# Patient Record
Sex: Male | Born: 1975 | Race: White | Hispanic: No | Marital: Married | Smoking: Current some day smoker
Health system: Southern US, Community
[De-identification: ages and names within clinical notes are randomized; demographics above are authoritative.]

## PROBLEM LIST (undated history)

## (undated) DIAGNOSIS — F419 Anxiety disorder, unspecified: Secondary | ICD-10-CM

## (undated) DIAGNOSIS — K859 Acute pancreatitis without necrosis or infection, unspecified: Secondary | ICD-10-CM

## (undated) DIAGNOSIS — N4400 Torsion of testis, unspecified: Secondary | ICD-10-CM

## (undated) DIAGNOSIS — J45909 Unspecified asthma, uncomplicated: Secondary | ICD-10-CM

## (undated) DIAGNOSIS — N200 Calculus of kidney: Secondary | ICD-10-CM

## (undated) DIAGNOSIS — Z87891 Personal history of nicotine dependence: Secondary | ICD-10-CM

## (undated) HISTORY — DX: Torsion of testis, unspecified: N44.00

## (undated) HISTORY — DX: Calculus of kidney: N20.0

## (undated) HISTORY — DX: Unspecified asthma, uncomplicated: J45.909

## (undated) HISTORY — DX: Acute pancreatitis without necrosis or infection, unspecified: K85.90

## (undated) HISTORY — DX: Personal history of nicotine dependence: Z87.891

---
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
## 2010-02-26 ENCOUNTER — Ambulatory Visit: Payer: Self-pay | Admitting: Family Medicine

## 2010-02-26 DIAGNOSIS — B359 Dermatophytosis, unspecified: Secondary | ICD-10-CM | POA: Insufficient documentation

## 2010-03-05 LAB — CONVERTED CEMR LAB
ALT: 26 units/L (ref 0–53)
AST: 25 units/L (ref 0–37)
Albumin: 3.8 g/dL (ref 3.5–5.2)
Alkaline Phosphatase: 64 units/L (ref 39–117)
BUN: 9 mg/dL (ref 6–23)
Bilirubin, Direct: 0.1 mg/dL (ref 0.0–0.3)
CO2: 27 meq/L (ref 19–32)
Calcium: 9.1 mg/dL (ref 8.4–10.5)
Chloride: 104 meq/L (ref 96–112)
Cholesterol: 164 mg/dL (ref 0–200)
Creatinine, Ser: 0.8 mg/dL (ref 0.4–1.5)
GFR calc non Af Amer: 112.03 mL/min (ref >60.00)
Glucose, Bld: 84 mg/dL (ref 70–99)
HDL: 41.3 mg/dL (ref >39.00)
LDL Cholesterol: 113 mg/dL — ABNORMAL HIGH (ref 0–99)
Potassium: 4.3 meq/L (ref 3.5–5.1)
Sodium: 138 meq/L (ref 135–145)
Total Bilirubin: 0.5 mg/dL (ref 0.3–1.2)
Total CHOL/HDL Ratio: 4
Total Protein: 6.5 g/dL (ref 6.0–8.3)
Triglycerides: 51 mg/dL (ref 0.0–149.0)
VLDL: 10.2 mg/dL (ref 0.0–40.0)

## 2010-04-02 NOTE — Assessment & Plan Note (Signed)
Summary: NEW PT TO EST/CPX/CLE   Vital Signs:  Patient profile:   35 year old male Height:      73 inches Weight:      208.75 pounds BMI:     27.64 Temp:     98.5 degrees F oral Pulse rate:   80 / minute Pulse rhythm:   regular BP sitting:   122 / 80  (left arm) Cuff size:   large  Vitals Entered By: Selena Batten Dance CMA Duncan Dull) (February 26, 2010 10:33 AM) CC: New patient to establish care Comments ? Ringworm   History of Present Illness: CC: establish, ?ringworm  1. ringworm, a few spots on face, one on stomach.  Lotrimin makes it go away (uses 1-2 wks).  Currently one spot R cheek and one spot R stomach.  No new pets.  Wife's family has dog.  Has noticed for 4 mo now.  intolerant to cornmeal  preventative: flu shot - declines tetanus - unsure.  requests Tdap today no recent blood work.  maybe 2 years ago.  Thinks [place].     Current Medications (verified): 1)  None  Allergies (verified): No Known Drug Allergies  Past History:  Past Medical History: asthma as child pancreatitis 2006  Past Surgical History: L testicular surgery for torsion 1995  Family History: F: healthy M: slight MI (mid 38s) (smoker) MGM: DM PGF: brain tumor  No CAD/MI, CVA, other CA  Social History: quit smoking (2009)  ~8 PY hx, no EtOH, no rec drugs caffeine: 3-4 sodas/day, 1-2 cups sweet tea/day Occupation: Financial planner at [employer], broadcasting/film/video, Tree surgeon Lives with wife, no pets some college, [employer] eats salads, some fruits and vegetables activity - walk occasionally  Review of Systems       The patient complains of headaches.  The patient denies anorexia, fever, weight loss, weight gain, vision loss, decreased hearing, hoarseness, chest pain, syncope, dyspnea on exertion, peripheral edema, prolonged cough, hemoptysis, abdominal pain, melena, hematochezia, severe indigestion/heartburn, hematuria, depression, and testicular masses.         Occasional HAs - attributed to work, gets  HA if fsats for long period of time.  Physical Exam  General:  Well-developed,well-nourished,in no acute distress; alert,appropriate and cooperative throughout examination Head:  Normocephalic and atraumatic without obvious abnormalities. No apparent alopecia or balding. Eyes:  No corneal or conjunctival inflammation noted. EOMI. Perrla.  Ears:  TMs clear bilaterally Nose:  nares clear bilaterally Mouth:  MMM, no pharyngeal erythema.  good dentition Neck:  No deformities, masses, or tenderness noted. Lungs:  Normal respiratory effort, chest expands symmetrically. Lungs are clear to auscultation, no crackles or wheezes. Heart:  Normal rate and regular rhythm. S1 and S2 normal without gallop, murmur, click, rub or other extra sounds. Abdomen:  Bowel sounds positive,abdomen soft and non-tender without masses, organomegaly or hernias noted. Msk:  No deformity or scoliosis noted of thoracic or lumbar spine.   Pulses:  2+ rad pulses, brisk cap refill Extremities:  No clubbing, cyanosis, edema, or deformity noted with normal full range of motion of all joints.   Neurologic:  CN grossly intact ,station and gait intact. Skin:  R angle of jaw wtih erythematous patch, scaly.  L abd with about 1inch patch of erythematous pruritic scaly skin Psych:  full affect, [place] and cooperative.   Impression & Recommendations:  Problem # 1:  HEALTH SCREENING (ICD-V70.0) Reviewed preventive care protocols, scheduled due services, and updated immunizations.  Tdap today.  discussed healthy eating, decreased sodas, increased fruits/vegetables, increased exercise (  make into routine.)  Orders: TLB-BMP (Basic Metabolic Panel-BMET) (80048-METABOL) TLB-Hepatic/Liver Function Pnl (80076-HEPATIC) TLB-Lipid Panel (80061-LIPID)  Problem # 2:  RINGWORM (ICD-110.9) failed lotrimin.  treat with diflucan 150mg  weekly x 2 wks.  if returns, consider 4 wk treatment.  advised continued lotrimin while on diflucan.  checked  LFTs today.  Complete Medication List: 1)  Fluconazole 150 Mg Tabs (Fluconazole) .... One weekly x 2 wks  Other Orders: Tdap => 57yrs IM (51884) Admin 1st Vaccine (16606)  Patient Instructions: 1)  Please return in 1-2 years for next physical or as needed. 2)  For ringworm, treat with diflucan x 1 rpt in 1 wk. 3)  Tdap today. 4)  Blood work today. 5)  Good to meet you today!  Call clinic with quetsions. Prescriptions: FLUCONAZOLE 150 MG TABS (FLUCONAZOLE) one weekly x 2 wks  #2 x 0   Entered and Authorized by:   Eustaquio Boyden  MD   Signed by:   Eustaquio Boyden  MD on 02/26/2010   Method used:   Electronically to        [facility]  [address] [id number] (retail)       [address]       [place], [place]  [address]       Ph: [phone] or [phone]       Fax: [phone]   RxID:   [id number]    Orders Added: 1)  TLB-BMP (Basic Metabolic Panel-BMET) [80048-METABOL] 2)  TLB-Hepatic/Liver Function Pnl [80076-HEPATIC] 3)  TLB-Lipid Panel [80061-LIPID] 4)  New Patient 18-39 years [99385] 5)  Tdap => 73yrs IM [90715] 6)  Admin 1st Vaccine [90471]   Immunizations Administered:  Tetanus Vaccine:    Vaccine Type: Tdap    Site: left deltoid    Mfr: GlaxoSmithKline    Dose: 0.5 ml    Route: IM    Given by: Selena Batten Dance CMA (AAMA)    Exp. Date: 12/19/2011    Lot #: [id number]    VIS given: 01/17/08 version given February 26, 2010.   Immunizations Administered:  Tetanus Vaccine:    Vaccine Type: Tdap    Site: left deltoid    Mfr: GlaxoSmithKline    Dose: 0.5 ml    Route: IM    Given by: Selena Batten Dance CMA (AAMA)    Exp. Date: 12/19/2011    Lot #: [id number]    VIS given: 01/17/08 version given February 26, 2010.  Prior Medications: Current Allergies (reviewed today): No known allergies

## 2011-08-10 ENCOUNTER — Encounter: Payer: Self-pay | Admitting: Family Medicine

## 2011-08-10 ENCOUNTER — Ambulatory Visit (INDEPENDENT_AMBULATORY_CARE_PROVIDER_SITE_OTHER): Payer: BC Managed Care – PPO | Admitting: Family Medicine

## 2011-08-10 VITALS — BP 122/76 | HR 88 | Temp 98.4°F | Ht 73.0 in | Wt 199.8 lb

## 2011-08-10 DIAGNOSIS — N50812 Left testicular pain: Secondary | ICD-10-CM

## 2011-08-10 DIAGNOSIS — N509 Disorder of male genital organs, unspecified: Secondary | ICD-10-CM

## 2011-08-10 DIAGNOSIS — R109 Unspecified abdominal pain: Secondary | ICD-10-CM

## 2011-08-10 DIAGNOSIS — R1030 Lower abdominal pain, unspecified: Secondary | ICD-10-CM | POA: Insufficient documentation

## 2011-08-10 LAB — POCT URINALYSIS DIPSTICK
Bilirubin, UA: NEGATIVE
Glucose, UA: NEGATIVE
Ketones, UA: NEGATIVE
Leukocytes, UA: NEGATIVE
Nitrite, UA: NEGATIVE
Protein, UA: NEGATIVE
Spec Grav, UA: 1.01
Urobilinogen, UA: 0.2
pH, UA: 7

## 2011-08-10 MED ORDER — NAPROXEN 500 MG PO TABS: ORAL_TABLET | ORAL | Status: DC

## 2011-08-10 NOTE — Assessment & Plan Note (Signed)
Exam, UA normal.  Reassured. Groin strain vs congestive epididymitis.   No testicular pain today. rec supportive care with NSAIDs, and elevation of scrotum. If not improving as expected, check scrotal US.

## 2011-08-10 NOTE — Patient Instructions (Signed)
Urine looking ok. Treat with naprosyn twice daily with food for 5 days then as needed (don't take with ibuprofen). Elevate scrotum. If not better after 1-2 weeks, or any worsening, please let me know for scrotal ultrasound. Good to see you today, call us with questions.

## 2011-08-10 NOTE — Progress Notes (Signed)
  Subjective:    Patient ID: Justin Webb, male    DOB: [birth year], 36 y.o.   MRN: [id number]  HPI CC: dull testicular pain (left)  First noticed left testicular pain several weeks ago, noticed again on Sunday.  Comes and goes.  Yesterday afternoon again had sxs.  Not severe pain.  Left leg feels "tight".  Denies injury/trauma to area.  Lives with wife.  Recently more sexually active over last month (trying to have child).  Denies abd pain, f/c, n/v, urethral discharge.  No recent heavy lifting.  H/o left testicular torsion s/p surgery (1995).    Medications and allergies reviewed and updated in chart.  Past histories reviewed and updated if relevant as below. Patient Active Problem List  Diagnoses  . RINGWORM   Past Medical History  Diagnosis Date  . Childhood asthma   . Pancreatitis   . Testicular torsion     left   Past Surgical History  Procedure Date  . Testicular torsion 1995    left   History  Substance Use Topics  . Smoking status: Former Smoker    Quit date: 03/02/2007  . Smokeless tobacco: Not on file  . Alcohol Use: Yes     Rare   Family History  Problem Relation Age of Onset  . Healthy Father   . Heart attack Mother     slight; + smoker; Mid 40's  . Diabetes Maternal Grandmother   . Other Paternal Grandfather     Brain tumor   No Known Allergies No current outpatient prescriptions on file prior to visit.     Review of Systems Per HPI    Objective:   Physical Exam  Nursing note and vitals reviewed. Constitutional: He appears well-developed and well-nourished. No distress.  HENT:  Head: Normocephalic and atraumatic.  Mouth/Throat: Oropharynx is clear and moist. No oropharyngeal exudate.  Abdominal: Soft. Bowel sounds are normal. He exhibits no distension and no mass. There is no tenderness. There is no rebound and no guarding. Hernia confirmed negative in the right inguinal area and confirmed negative in the left inguinal area.    Genitourinary: Testes normal and penis normal. Right testis shows no mass, no swelling and no tenderness. Right testis is descended. Left testis shows no mass, no swelling and no tenderness. Left testis is descended. No phimosis, hypospadias or penile tenderness.       No epididymal tenderness. No hydrocele.  Lymphadenopathy:       Right: No inguinal adenopathy present.       Left: No inguinal adenopathy present.       Assessment & Plan:

## 2011-10-16 ENCOUNTER — Emergency Department (HOSPITAL_COMMUNITY)
Admission: EM | Admit: 2011-10-16 | Discharge: 2011-10-16 | Disposition: A | Discharge status: Home / Self Care | Payer: BC Managed Care – PPO | Attending: Emergency Medicine | Admitting: Emergency Medicine

## 2011-10-16 ENCOUNTER — Emergency Department (HOSPITAL_COMMUNITY): Payer: BC Managed Care – PPO

## 2011-10-16 ENCOUNTER — Encounter (HOSPITAL_COMMUNITY): Payer: Self-pay | Admitting: *Deleted

## 2011-10-16 DIAGNOSIS — F17200 Nicotine dependence, unspecified, uncomplicated: Secondary | ICD-10-CM | POA: Insufficient documentation

## 2011-10-16 DIAGNOSIS — R079 Chest pain, unspecified: Secondary | ICD-10-CM

## 2011-10-16 LAB — POCT I-STAT TROPONIN I: Troponin i, poc: 0 ng/mL (ref 0.00–0.08)

## 2011-10-16 LAB — BASIC METABOLIC PANEL
BUN: 10 mg/dL (ref 6–23)
CO2: 26 mEq/L (ref 19–32)
Calcium: 9.5 mg/dL (ref 8.4–10.5)
Chloride: 104 mEq/L (ref 96–112)
Creatinine, Ser: 0.91 mg/dL (ref 0.50–1.35)
GFR calc Af Amer: >90 mL/min (ref >90)
GFR calc non Af Amer: >90 mL/min (ref >90)
Glucose, Bld: 90 mg/dL (ref 70–99)
Potassium: 3.8 mEq/L (ref 3.5–5.1)
Sodium: 140 mEq/L (ref 135–145)

## 2011-10-16 LAB — CBC
HCT: 45.5 % (ref 39.0–52.0)
Hemoglobin: 15.6 g/dL (ref 13.0–17.0)
MCH: 30.7 pg (ref 26.0–34.0)
MCHC: 34.3 g/dL (ref 30.0–36.0)
MCV: 89.6 fL (ref 78.0–100.0)
Platelets: 219 10*3/uL (ref 150–400)
RBC: 5.08 MIL/uL (ref 4.22–5.81)
RDW: 13 % (ref 11.5–15.5)
WBC: 6.5 10*3/uL (ref 4.0–10.5)

## 2011-10-16 NOTE — ED Provider Notes (Signed)
History     CSN: [id number]  Arrival date & time 10/16/11  4098   First MD Initiated Contact with Patient 10/16/11 1005      Chief Complaint  Patient presents with  . Chest Pain    (Consider location/radiation/quality/duration/timing/severity/associated sxs/prior treatment) Patient is a 36 y.o. male presenting with chest pain. The history is provided by the patient.  Chest Pain The chest pain began yesterday. Chest pain occurs intermittently. The chest pain is resolved. The severity of the pain is mild. The quality of the pain is described as tightness and dull. The pain radiates to the left shoulder and left arm. Pertinent negatives for primary symptoms include no fever, no cough, no palpitations, no abdominal pain, no nausea, no vomiting and no dizziness.  Pertinent negatives for associated symptoms include no numbness and no weakness.   PT states pain started yesterday while he was watching TV. Pain on and off since then. States no hx of the same. Feels "like dull discomfort." Denies prior pain in his chest with exertions. No SOB, diaphoresis, nausea, vomiting, dizziness. No pain in abdomen. Did not take any medications. Family hx of MI in his mother in her 55s.   Past Medical History  Diagnosis Date  . Childhood asthma   . Pancreatitis   . Testicular torsion     left    Past Surgical History  Procedure Date  . Testicular torsion 1995    left    Family History  Problem Relation Age of Onset  . Healthy Father   . Heart attack Mother     slight; + smoker; Mid 40's  . Diabetes Maternal Grandmother   . Other Paternal Grandfather     Brain tumor    History  Substance Use Topics  . Smoking status: Current Some Day Smoker    Types: Cigarettes    Last Attempt to Quit: 03/02/2007  . Smokeless tobacco: Not on file  . Alcohol Use: Yes     Rare      Review of Systems  Constitutional: Negative for fever and chills.  HENT: Negative for neck pain and neck stiffness.     Eyes: Negative for visual disturbance.  Respiratory: Negative for cough.   Cardiovascular: Positive for chest pain. Negative for palpitations and leg swelling.  Gastrointestinal: Negative for nausea, vomiting and abdominal pain.  Genitourinary: Negative for dysuria.  Musculoskeletal: Negative for back pain and joint swelling.  Skin: Negative.   Neurological: Negative for dizziness, weakness and numbness.    Allergies  Review of patient's allergies indicates no known allergies.  Home Medications   Current Outpatient Rx  Name Route Sig Dispense Refill  . IBUPROFEN 200 MG PO TABS Oral Take 800 mg by mouth every 6 (six) hours as needed. For pain    . ADULT MULTIVITAMIN W/MINERALS CH Oral Take 1 tablet by mouth daily.    Marland Kitchen CLEAR EYES OP Both Eyes Place 2 drops into both eyes daily as needed. For dry eyes      BP 128/76  Pulse 79  Temp 98.1 F (36.7 C) (Oral)  Resp 20  SpO2 99%  Physical Exam  Nursing note and vitals reviewed. Constitutional: He is oriented to person, place, and time. He appears well-developed and well-nourished. No distress.  HENT:  Head: Normocephalic.  Eyes: Conjunctivae are normal.  Neck: Normal range of motion. Neck supple.  Cardiovascular: Normal rate, regular rhythm and normal heart sounds.   Pulmonary/Chest: Effort normal and breath sounds normal. No respiratory distress. He  has no wheezes. He has no rales. He exhibits no tenderness.  Abdominal: Soft. Bowel sounds are normal. He exhibits no distension. There is no tenderness.  Musculoskeletal: Normal range of motion. He exhibits no edema.  Neurological: He is alert and oriented to person, place, and time.  Skin: Skin is warm and dry.  Psychiatric: He has a normal mood and affect.    ED Course  Procedures (including critical care time)  Pt with CP intermittently for the last 12hrs. Pt has no hx of the same. He is TIMI0, no risk factors for PE, PERC negative. Labs and CXR pending.   Results for  orders placed during the hospital encounter of 10/16/11  CBC      Component Value Range   WBC 6.5  4.0 - 10.5 K/uL   RBC 5.08  4.22 - 5.81 MIL/uL   Hemoglobin 15.6  13.0 - 17.0 g/dL   HCT 16.1  09.6 - 04.5 %   MCV 89.6  78.0 - 100.0 fL   MCH 30.7  26.0 - 34.0 pg   MCHC 34.3  30.0 - 36.0 g/dL   RDW 40.9  81.1 - 91.4 %   Platelets 219  150 - 400 K/uL  BASIC METABOLIC PANEL      Component Value Range   Sodium 140  135 - 145 mEq/L   Potassium 3.8  3.5 - 5.1 mEq/L   Chloride 104  96 - 112 mEq/L   CO2 26  19 - 32 mEq/L   Glucose, Bld 90  70 - 99 mg/dL   BUN 10  6 - 23 mg/dL   Creatinine, Ser 7.82  0.50 - 1.35 mg/dL   Calcium 9.5  8.4 - 95.6 mg/dL   GFR calc non Af Amer >90  >90 mL/min   GFR calc Af Amer >90  >90 mL/min  POCT I-STAT TROPONIN I      Component Value Range   Troponin i, poc 0.00  0.00 - 0.08 ng/mL   Comment 3            Dg Chest 2 View  10/16/2011  *RADIOLOGY REPORT*  Clinical Data: Chest pain  CHEST - 2 VIEW  Comparison: None.  Findings: Lungs clear.  Heart size and pulmonary vascularity are normal.  No adenopathy.  No bone lesions identified.  IMPRESSION: No edema or consolidation.  Original Report Authenticated By: Arvin Collard. WOODRUFF III, M.D.    Date: 10/16/2011  Rate: 73  Rhythm: normal sinus rhythm  QRS Axis: normal  Intervals: normal  ST/T Wave abnormalities: normal  Conduction Disutrbances: none  Narrative Interpretation:   Old EKG Reviewed: No Old      11:27 AM Pt CP free, NAD. Troponin and CXR negative. Pt is low risk with only risk factor for CAD is family hx of mother with MI at age 10. Pt's symptoms are atypical, non exertional, no associated symptoms. Explained results to pt. He has a PCP and is able to see them on Monday for follow up and and stress test. Will d/c home.  1. Chest pain       MDM          Lottie Mussel, PA 10/16/11 1621

## 2011-10-16 NOTE — ED Notes (Signed)
Pt reports (L) side chest pain radiating to (L) shoulder, first episode last night while lying in bed, the second episode this am while sitting at his desk working on the computer. Pt denies N/V/D, back/abd pain, SOB, diaphoretic, weakness, or dizziness. Pt reports he received "bad news" yesterday that has been "stressful" for him. Pt describes the pain as a dull pain

## 2011-10-16 NOTE — ED Notes (Signed)
Patient transported to X-ray 

## 2011-10-16 NOTE — ED Notes (Signed)
Reports having a dullness/tightness to left side of chest that started last night, radiating to left arm. Denies any n/v or sob. No acute distress noted at triage, ekg done.

## 2011-10-17 NOTE — ED Provider Notes (Signed)
Medical screening examination/treatment/procedure(s) were performed by non-physician practitioner and as supervising physician I was immediately available for consultation/collaboration.   Rolan Bucco, MD 10/17/11 [id number]

## 2011-10-19 ENCOUNTER — Ambulatory Visit (INDEPENDENT_AMBULATORY_CARE_PROVIDER_SITE_OTHER): Payer: BC Managed Care – PPO | Admitting: Family Medicine

## 2011-10-19 ENCOUNTER — Encounter: Payer: Self-pay | Admitting: Family Medicine

## 2011-10-19 VITALS — BP 100/64 | HR 64 | Temp 97.7°F | Wt 198.5 lb

## 2011-10-19 DIAGNOSIS — R079 Chest pain, unspecified: Secondary | ICD-10-CM

## 2011-10-19 DIAGNOSIS — R0789 Other chest pain: Secondary | ICD-10-CM | POA: Insufficient documentation

## 2011-10-19 NOTE — Patient Instructions (Signed)
This could be coming from shoulders, possible musculoskeletal pain - may use ibuprofen for this as needed. If chest discomfort not improving in next few weeks, let me know for referral to heart doctor for stress test (likely treadmill). If worsening in meantime, please seek care again. Good to see you today, call us with questions.

## 2011-10-19 NOTE — Progress Notes (Signed)
  Subjective:    Patient ID: Justin Webb, male    DOB: 1975/09/23, 36 y.o.   MRN: [id number]  HPI CC: f/u ER chest pain  Mr. Laino is Borkowski 36 yo male some day smoker with h/o childhood asthma who presents today for f/u of recent hospitalization for chest pain.  Records reviewed: normal CBC, BMP, CXR and cardiac enzymes x1.  ED EKG NSR at rate of 73.  Endorses chest discomfort left side of chest wall, may have traveled to left shoulder (sore) and down arm.  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  That night improved.  Next day while getting ready for work, noticed pain returning.  Decided to go to ER.  W/u negative.  Since then, no longer bothering him.  Took ibuprofen for chest discomfort on Friday night.  Discomfort comes on randomly.  Not food related.  Not exertional.  No fevers/chills, coughing, wheezing, SOB, congestion/coryza, dizziness, lightheadedness, palpitations.  No h/o ED.  No reflux/heartburn.  Occasional left shoulder pain (sleeps on left side).   walks on treadmill for exercise, sxs have never come on while walking.  Takes NSAIDs a few times a week for headache (OTC ibuprofen up to 800mg ).  Smoking - some days - a few times a month 1-2 cigarettes, not daily.  Quit smoking 1 ppd around 2010  Day had longer episode of pain - had received bad news so stress level was very high.  Wonders if related to this.  Denies significant h/o anxiety.  Wt Readings from Last 3 Encounters:  10/19/11 198 lb 8 oz (90.039 kg)  08/10/11 199 lb 12 oz (90.606 kg)  02/26/10 208 lb 12 oz (94.688 kg)    Family history significant for mother with slight MI at age mid 81s (was smoker), no surgery needed.  Past Medical History  Diagnosis Date  . Childhood asthma   . Pancreatitis   . Testicular torsion     left    Family History  Problem Relation Age of Onset  . Healthy Father   . Heart attack  Mother     slight; + smoker; Mid 40's  . Diabetes Maternal Grandmother   . Other Paternal Grandfather     Brain tumor    Review of Systems Per HPI    Objective:   Physical Exam  Nursing note and vitals reviewed. Constitutional: He appears well-developed and well-nourished. No distress.  HENT:  Head: Normocephalic and atraumatic.  Mouth/Throat: Oropharynx is clear and moist. No oropharyngeal exudate.  Eyes: Conjunctivae and EOM are normal. Pupils are equal, round, and reactive to light. No scleral icterus.  Neck: Normal range of motion. Neck supple.  Cardiovascular: Normal rate, regular rhythm, normal heart sounds and intact distal pulses.   No murmur heard. Pulmonary/Chest: Effort normal and breath sounds normal. No respiratory distress. He has no wheezes. He has no rales. He exhibits no tenderness.       Chest discomfort not reproducible  Musculoskeletal:       FROM shoulders without pain.  No pain/weakness with testing SITS (int/ext rotation at shoulders, empty can sign negative)  Skin: Skin is warm and dry. No rash noted.       Assessment & Plan:

## 2011-10-19 NOTE — Assessment & Plan Note (Signed)
With worsening stress recently. Very atypical.  Anticipate noncardiac cause, ?MSK.  However does have early fmhx CAD (mother). Will monitor sxs for now, if returning in next few weeks, to let me know for cardiology referral. If worsening in meantime, discussed seeking urgent care.

## 2011-12-31 DIAGNOSIS — N200 Calculus of kidney: Secondary | ICD-10-CM

## 2011-12-31 HISTORY — DX: Calculus of kidney: N20.0

## 2012-01-18 ENCOUNTER — Encounter (HOSPITAL_COMMUNITY): Payer: Self-pay | Admitting: Emergency Medicine

## 2012-01-18 ENCOUNTER — Emergency Department (HOSPITAL_COMMUNITY)
Admission: EM | Admit: 2012-01-18 | Discharge: 2012-01-18 | Disposition: A | Discharge status: Home / Self Care | Payer: BC Managed Care – PPO | Attending: Emergency Medicine | Admitting: Emergency Medicine

## 2012-01-18 ENCOUNTER — Emergency Department (HOSPITAL_COMMUNITY): Payer: BC Managed Care – PPO

## 2012-01-18 DIAGNOSIS — N201 Calculus of ureter: Secondary | ICD-10-CM | POA: Insufficient documentation

## 2012-01-18 DIAGNOSIS — R319 Hematuria, unspecified: Secondary | ICD-10-CM | POA: Insufficient documentation

## 2012-01-18 DIAGNOSIS — F17200 Nicotine dependence, unspecified, uncomplicated: Secondary | ICD-10-CM | POA: Insufficient documentation

## 2012-01-18 DIAGNOSIS — N200 Calculus of kidney: Secondary | ICD-10-CM

## 2012-01-18 DIAGNOSIS — Z8719 Personal history of other diseases of the digestive system: Secondary | ICD-10-CM | POA: Insufficient documentation

## 2012-01-18 DIAGNOSIS — Z87448 Personal history of other diseases of urinary system: Secondary | ICD-10-CM | POA: Insufficient documentation

## 2012-01-18 DIAGNOSIS — Z87798 Personal history of other (corrected) congenital malformations: Secondary | ICD-10-CM | POA: Insufficient documentation

## 2012-01-18 LAB — URINALYSIS, ROUTINE W REFLEX MICROSCOPIC
Glucose, UA: NEGATIVE mg/dL
Ketones, ur: 15 mg/dL — AB
Nitrite: NEGATIVE
Protein, ur: 100 mg/dL — AB
Specific Gravity, Urine: 1.031 — ABNORMAL HIGH (ref 1.005–1.030)
Urobilinogen, UA: 0.2 mg/dL (ref 0.0–1.0)
pH: 6 (ref 5.0–8.0)

## 2012-01-18 LAB — CBC WITH DIFFERENTIAL/PLATELET
Basophils Absolute: 0.1 10*3/uL (ref 0.0–0.1)
Basophils Relative: 1 % (ref 0–1)
Eosinophils Absolute: 0.4 10*3/uL (ref 0.0–0.7)
Eosinophils Relative: 4 % (ref 0–5)
HCT: 47.8 % (ref 39.0–52.0)
Hemoglobin: 16.6 g/dL (ref 13.0–17.0)
Lymphocytes Relative: 36 % (ref 12–46)
Lymphs Abs: 3.5 10*3/uL (ref 0.7–4.0)
MCH: 31 pg (ref 26.0–34.0)
MCHC: 34.7 g/dL (ref 30.0–36.0)
MCV: 89.3 fL (ref 78.0–100.0)
Monocytes Absolute: 0.8 10*3/uL (ref 0.1–1.0)
Monocytes Relative: 8 % (ref 3–12)
Neutro Abs: 4.9 10*3/uL (ref 1.7–7.7)
Neutrophils Relative %: 51 % (ref 43–77)
Platelets: 284 10*3/uL (ref 150–400)
RBC: 5.35 MIL/uL (ref 4.22–5.81)
RDW: 12.9 % (ref 11.5–15.5)
WBC: 9.6 10*3/uL (ref 4.0–10.5)

## 2012-01-18 LAB — COMPREHENSIVE METABOLIC PANEL
ALT: 24 U/L (ref 0–53)
AST: 22 U/L (ref 0–37)
Albumin: 4.4 g/dL (ref 3.5–5.2)
Alkaline Phosphatase: 75 U/L (ref 39–117)
BUN: 14 mg/dL (ref 6–23)
CO2: 23 mEq/L (ref 19–32)
Calcium: 9.8 mg/dL (ref 8.4–10.5)
Chloride: 100 mEq/L (ref 96–112)
Creatinine, Ser: 0.98 mg/dL (ref 0.50–1.35)
GFR calc Af Amer: >90 mL/min (ref >90)
GFR calc non Af Amer: >90 mL/min (ref >90)
Glucose, Bld: 95 mg/dL (ref 70–99)
Potassium: 3.7 mEq/L (ref 3.5–5.1)
Sodium: 138 mEq/L (ref 135–145)
Total Bilirubin: 0.6 mg/dL (ref 0.3–1.2)
Total Protein: 7.4 g/dL (ref 6.0–8.3)

## 2012-01-18 LAB — LIPASE, BLOOD: Lipase: 21 U/L (ref 11–59)

## 2012-01-18 LAB — URINE MICROSCOPIC-ADD ON

## 2012-01-18 MED ORDER — HYDROMORPHONE HCL PF 1 MG/ML IJ SOLN
INTRAMUSCULAR | Status: AC
  Administered 2012-01-18: 1 mg
  Filled 2012-01-18: qty 1

## 2012-01-18 MED ORDER — OXYCODONE-ACETAMINOPHEN 5-325 MG PO TABS: 1.0000 | ORAL_TABLET | Freq: Four times a day (QID) | ORAL | Status: DC | PRN

## 2012-01-18 MED ORDER — ONDANSETRON HCL 4 MG/2ML IJ SOLN
4.0000 mg | Freq: Once | INTRAMUSCULAR | Status: AC
  Administered 2012-01-18: 4 mg via INTRAVENOUS

## 2012-01-18 MED ORDER — TAMSULOSIN HCL 0.4 MG PO CAPS
0.4000 mg | ORAL_CAPSULE | Freq: Every day | ORAL | Status: DC
Start: 2012-01-18 — End: 2012-06-13

## 2012-01-18 MED ORDER — ONDANSETRON HCL 4 MG/2ML IJ SOLN
INTRAMUSCULAR | Status: AC
  Filled 2012-01-18: qty 2

## 2012-01-18 MED ORDER — HYDROMORPHONE HCL 2 MG PO TABS: 1.0000 mg | ORAL_TABLET | Freq: Once | ORAL | Status: DC

## 2012-01-18 MED ORDER — KETOROLAC TROMETHAMINE 30 MG/ML IJ SOLN
30.0000 mg | Freq: Once | INTRAMUSCULAR | Status: AC
  Administered 2012-01-18: 30 mg via INTRAVENOUS
  Filled 2012-01-18: qty 1

## 2012-01-18 MED ORDER — ONDANSETRON HCL 4 MG PO TABS: 4.0000 mg | ORAL_TABLET | Freq: Four times a day (QID) | ORAL | Status: DC

## 2012-01-18 NOTE — ED Provider Notes (Signed)
Medical screening examination/treatment/procedure(s) were performed by non-physician practitioner and as supervising physician I was immediately available for consultation/collaboration.   Berdella Bacot M Daeveon Zweber, MD 01/18/12 1651 

## 2012-01-18 NOTE — ED Notes (Signed)
Voided brown urine and left lower abd pain and groin pain since this am

## 2012-01-18 NOTE — ED Provider Notes (Signed)
History     CSN: [id number]  Arrival date & time 01/18/12  1139   First MD Initiated Contact with Patient 01/18/12 1222      No chief complaint on file.   (Consider location/radiation/quality/duration/timing/severity/associated sxs/prior treatment) HPI  36 year old male with history of left testicular torsion presents complaining of left groin pain. Patient states he was at work today, though the urge to urinate and when urinating he notice brownish urine. He subsequently developed acute onset of sharp throbbing pain to his left groin. Pain radiates up to his left flank. Moderate in severity, nothing makes it better or worse. He denies fever, chills, chest pain, shortness of breath, nausea, vomiting, diarrhea, urinary frequency, penile discharge, testicular tenderness or swelling. Patient has no prior history of kidney stone.  Past Medical History  Diagnosis Date  . Childhood asthma   . Pancreatitis   . Testicular torsion     left    Past Surgical History  Procedure Date  . Testicular torsion 1995    left    Family History  Problem Relation Age of Onset  . Healthy Father   . Heart attack Mother     slight; + smoker; Mid 40's  . Diabetes Maternal Grandmother   . Other Paternal Grandfather     Brain tumor    History  Substance Use Topics  . Smoking status: Current Some Day Smoker    Types: Cigarettes  . Smokeless tobacco: Never Used  . Alcohol Use: Yes     Comment: Rare      Review of Systems  Constitutional: Negative for fever.  Cardiovascular: Negative for leg swelling.  Gastrointestinal: Negative for blood in stool.  Genitourinary: Positive for hematuria.  Musculoskeletal: Negative for back pain.    Allergies  Review of patient's allergies indicates no known allergies.  Home Medications   Current Outpatient Rx  Name  Route  Sig  Dispense  Refill  . IBUPROFEN 200 MG PO TABS   Oral   Take 800 mg by mouth every 6 (six) hours as needed. For pain           . ADULT MULTIVITAMIN W/MINERALS CH   Oral   Take 1 tablet by mouth daily.         Marland Kitchen CLEAR EYES OP   Both Eyes   Place 2 drops into both eyes daily as needed. For dry eyes           BP 146/89  Pulse 58  Temp 97.4 F (36.3 C)  Resp 16  SpO2 99%  Physical Exam  Nursing note and vitals reviewed. Constitutional: He appears well-developed and well-nourished. He appears distressed (Appears uncomfortable, writhing in bed).  HENT:  Head: Atraumatic.  Mouth/Throat: Oropharynx is clear and moist.  Eyes: Conjunctivae normal are normal.  Neck: Normal range of motion. Neck supple.  Abdominal: Soft. There is tenderness (Mild tenderness to left lower quadrant without overlying skin changes. No hernia noted. No CVA tenderness on percussion). There is no rebound and no guarding. Hernia confirmed negative in the right inguinal area and confirmed negative in the left inguinal area.  Genitourinary: Penis normal. Right testis shows no mass, no swelling and no tenderness. Left testis shows no mass, no swelling and no tenderness. Circumcised. No penile tenderness.  Lymphadenopathy:       Right: No inguinal adenopathy present.       Left: No inguinal adenopathy present.    ED Course  Procedures (including critical care time)   Labs Reviewed  URINALYSIS, ROUTINE W REFLEX MICROSCOPIC  CBC WITH DIFFERENTIAL  COMPREHENSIVE METABOLIC PANEL  LIPASE, BLOOD   No results found.   No diagnosis found.  Results for orders placed during the hospital encounter of 01/18/12  URINALYSIS, ROUTINE W REFLEX MICROSCOPIC      Component Value Range   Color, Urine AMBER (*) YELLOW   APPearance CLOUDY (*) CLEAR   Specific Gravity, Urine 1.031 (*) 1.005 - 1.030   pH 6.0  5.0 - 8.0   Glucose, UA NEGATIVE  NEGATIVE mg/dL   Hgb urine dipstick LARGE (*) NEGATIVE   Bilirubin Urine SMALL (*) NEGATIVE   Ketones, ur 15 (*) NEGATIVE mg/dL   Protein, ur 454 (*) NEGATIVE mg/dL   Urobilinogen, UA 0.2  0.0 -  1.0 mg/dL   Nitrite NEGATIVE  NEGATIVE   Leukocytes, UA SMALL (*) NEGATIVE  CBC WITH DIFFERENTIAL      Component Value Range   WBC 9.6  4.0 - 10.5 K/uL   RBC 5.35  4.22 - 5.81 MIL/uL   Hemoglobin 16.6  13.0 - 17.0 g/dL   HCT 09.8  11.9 - 14.7 %   MCV 89.3  78.0 - 100.0 fL   MCH 31.0  26.0 - 34.0 pg   MCHC 34.7  30.0 - 36.0 g/dL   RDW 82.9  56.2 - 13.0 %   Platelets 284  150 - 400 K/uL   Neutrophils Relative 51  43 - 77 %   Neutro Abs 4.9  1.7 - 7.7 K/uL   Lymphocytes Relative 36  12 - 46 %   Lymphs Abs 3.5  0.7 - 4.0 K/uL   Monocytes Relative 8  3 - 12 %   Monocytes Absolute 0.8  0.1 - 1.0 K/uL   Eosinophils Relative 4  0 - 5 %   Eosinophils Absolute 0.4  0.0 - 0.7 K/uL   Basophils Relative 1  0 - 1 %   Basophils Absolute 0.1  0.0 - 0.1 K/uL  COMPREHENSIVE METABOLIC PANEL      Component Value Range   Sodium 138  135 - 145 mEq/L   Potassium 3.7  3.5 - 5.1 mEq/L   Chloride 100  96 - 112 mEq/L   CO2 23  19 - 32 mEq/L   Glucose, Bld 95  70 - 99 mg/dL   BUN 14  6 - 23 mg/dL   Creatinine, Ser 8.65  0.50 - 1.35 mg/dL   Calcium 9.8  8.4 - 78.4 mg/dL   Total Protein 7.4  6.0 - 8.3 g/dL   Albumin 4.4  3.5 - 5.2 g/dL   AST 22  0 - 37 U/L   ALT 24  0 - 53 U/L   Alkaline Phosphatase 75  39 - 117 U/L   Total Bilirubin 0.6  0.3 - 1.2 mg/dL   GFR calc non Af Amer >90  >90 mL/min   GFR calc Af Amer >90  >90 mL/min  LIPASE, BLOOD      Component Value Range   Lipase 21  11 - 59 U/L  URINE MICROSCOPIC-ADD ON      Component Value Range   Squamous Epithelial / LPF RARE  RARE   WBC, UA 3-6  <3 WBC/hpf   RBC / HPF TOO NUMEROUS TO COUNT  <3 RBC/hpf   Bacteria, UA FEW (*) RARE   Urine-Other MUCOUS PRESENT     Ct Abdomen Pelvis Wo Contrast  01/18/2012  *RADIOLOGY REPORT*  Clinical Data: Lower quadrant pain.  CT ABDOMEN AND PELVIS  WITHOUT CONTRAST  Technique:  Multidetector CT imaging of the abdomen and pelvis was performed following the standard protocol without intravenous contrast.   Comparison: None.  Findings: Dependent subsegmental atelectasis is present in both lower lobes.  2 mm right middle lobe nodule as shown on image 2 of series 3.  The visualized portion of the liver, spleen, pancreas, and adrenal glands appear unremarkable in noncontrast CT appearance.  The gallbladder and biliary system appear unremarkable.  There is a 2 mm right mid kidney nonobstructive calculus on image 36 of series 2.  Suspected punctate 1-2 mm nonobstructive calculi in the right kidney lower pole.  No hydronephrosis or hydroureter.  There is a 1 mm left distal ureteral calculus on image 47 of series 602, not causing significant hydronephrosis or hydroureter.  No other left-sided calculi observed.  Small retroperitoneal lymph nodes are not pathologically enlarged by size criteria. No pathologic pelvic adenopathy is identified.  Appendix unremarkable.  Small punctate calcification in the lower omentum is likely from focal remote inflammatory process or vascular calcification.  IMPRESSION:  1.  1 mm nonobstructive left distal ureteral calculus. 2.  Nonobstructive small right renal collecting system calculi.   Original Report Authenticated By: Gaylyn Rong, M.D.     1. Kidney stone, left  MDM  Patient presents with hematuria, and CVA tenderness, left groin tenderness suggestive of kidney stones. Patient has history of testicular torsion the past however I have low suspicion that this is related to testicular torsion. Pain medication given, will check UA.  2:39 PM UA with moderate amount of RBC, no evidence of infection.  CT scan ordered.  Pt currently pain controlled after receiving pain meds.   3:18 PM Pt has 1mm nonobstructive L distal ureteral calculus, no hydronephrosis or hydroureter.  Has nonobstructive small righr renal collecting system calculi.  Result were discussed with patient.  Recommend using strainer, pain meds, antinausea meds and f/u with urologist.  Pt stable for discharge.  He  agrees with plan.    BP 111/68  Pulse 58  Temp 97.4 F (36.3 C)  Resp 20  SpO2 95%  I have reviewed nursing notes and vital signs. I personally reviewed the imaging tests through PACS system  I reviewed available ER/hospitalization records thought the EMR     Fayrene Helper, PA-C 01/18/12 1524

## 2012-01-18 NOTE — ED Notes (Addendum)
Pt states pain started all of a sudden about ago, he states it feels like a cramping pain that comes and goes but gets sharp. Rates pain a 10/10. Pt also states when he went to the restroom and urinated it was brown in color.  Denies n/v.

## 2012-01-19 LAB — URINE CULTURE
Colony Count: NO GROWTH
Culture: NO GROWTH

## 2012-01-20 ENCOUNTER — Encounter: Payer: Self-pay | Admitting: Family Medicine

## 2012-06-13 ENCOUNTER — Emergency Department (HOSPITAL_COMMUNITY)
Admission: EM | Admit: 2012-06-13 | Discharge: 2012-06-13 | Disposition: A | Discharge status: Home / Self Care | Payer: BC Managed Care – PPO | Attending: Emergency Medicine | Admitting: Emergency Medicine

## 2012-06-13 ENCOUNTER — Emergency Department (HOSPITAL_COMMUNITY): Payer: BC Managed Care – PPO

## 2012-06-13 ENCOUNTER — Encounter (HOSPITAL_COMMUNITY): Payer: Self-pay | Admitting: Emergency Medicine

## 2012-06-13 DIAGNOSIS — J45909 Unspecified asthma, uncomplicated: Secondary | ICD-10-CM | POA: Insufficient documentation

## 2012-06-13 DIAGNOSIS — Z87448 Personal history of other diseases of urinary system: Secondary | ICD-10-CM | POA: Insufficient documentation

## 2012-06-13 DIAGNOSIS — Z87442 Personal history of urinary calculi: Secondary | ICD-10-CM | POA: Insufficient documentation

## 2012-06-13 DIAGNOSIS — R0789 Other chest pain: Secondary | ICD-10-CM | POA: Insufficient documentation

## 2012-06-13 DIAGNOSIS — F17200 Nicotine dependence, unspecified, uncomplicated: Secondary | ICD-10-CM | POA: Insufficient documentation

## 2012-06-13 DIAGNOSIS — Z8719 Personal history of other diseases of the digestive system: Secondary | ICD-10-CM | POA: Insufficient documentation

## 2012-06-13 DIAGNOSIS — Z7982 Long term (current) use of aspirin: Secondary | ICD-10-CM | POA: Insufficient documentation

## 2012-06-13 DIAGNOSIS — R079 Chest pain, unspecified: Secondary | ICD-10-CM

## 2012-06-13 LAB — CBC
HCT: 44 % (ref 39.0–52.0)
Hemoglobin: 15.6 g/dL (ref 13.0–17.0)
MCH: 30.5 pg (ref 26.0–34.0)
MCHC: 35.5 g/dL (ref 30.0–36.0)
MCV: 86.1 fL (ref 78.0–100.0)
Platelets: 214 10*3/uL (ref 150–400)
RBC: 5.11 MIL/uL (ref 4.22–5.81)
RDW: 13.1 % (ref 11.5–15.5)
WBC: 6.2 10*3/uL (ref 4.0–10.5)

## 2012-06-13 LAB — BASIC METABOLIC PANEL
BUN: 8 mg/dL (ref 6–23)
CO2: 25 mEq/L (ref 19–32)
Calcium: 9.6 mg/dL (ref 8.4–10.5)
Chloride: 105 mEq/L (ref 96–112)
Creatinine, Ser: 0.89 mg/dL (ref 0.50–1.35)
GFR calc Af Amer: >90 mL/min (ref >90)
GFR calc non Af Amer: >90 mL/min (ref >90)
Glucose, Bld: 86 mg/dL (ref 70–99)
Potassium: 4.1 mEq/L (ref 3.5–5.1)
Sodium: 139 mEq/L (ref 135–145)

## 2012-06-13 LAB — POCT I-STAT TROPONIN I: Troponin i, poc: 0 ng/mL (ref 0.00–0.08)

## 2012-06-13 NOTE — ED Notes (Signed)
C/o left side CP near axilla since Friday. Constant pain described as tightness with episodes of increased pain described as burning. Pain non exertional, tender to palpation. Denies injury, fever, cold, cough. Denies SOB, n/v, diaphoresis. Does report has "been under a lot of stress lately".

## 2012-06-13 NOTE — ED Provider Notes (Signed)
History     CSN: [id number]  Arrival date & time 06/13/12  1011   First MD Initiated Contact with Patient 06/13/12 1105      Chief Complaint  Patient presents with  . Chest Pain    Patient is a 37 y.o. male presenting with chest pain. The history is provided by the patient.  Chest Pain Pain location:  L chest Pain quality: burning and tightness   Pain radiates to:  Does not radiate Pain radiates to the back: no   Pain severity:  Mild Onset quality:  Gradual Duration: on and off for "awhile" Timing:  Intermittent Progression:  Worsening Chronicity:  Recurrent Relieved by: ibuprofen. Worsened by:  Nothing tried Associated symptoms: no abdominal pain, no back pain, no diaphoresis, no dizziness, no lower extremity edema, no near-syncope, no shortness of breath, no syncope and no weakness   pt reports CP He is unsure what worsens pain He denies any exertional CP No pleuritic pain No syncope He has no h/o CAD No recent travel/surgery He is currently CP free Past Medical History  Diagnosis Date  . Childhood asthma   . Pancreatitis   . Testicular torsion     left  . Kidney stone 12/2011    R    Past Surgical History  Procedure Laterality Date  . Testicular torsion  1995    left    Family History  Problem Relation Age of Onset  . Healthy Father   . Heart attack Mother     slight; + smoker; Mid 40's  . Diabetes Maternal Grandmother   . Other Paternal Grandfather     Brain tumor    History  Substance Use Topics  . Smoking status: Current Some Day Smoker    Types: Cigarettes  . Smokeless tobacco: Never Used  . Alcohol Use: Yes     Comment: Rare      Review of Systems  Constitutional: Negative for diaphoresis.  Respiratory: Negative for shortness of breath.   Cardiovascular: Positive for chest pain. Negative for syncope and near-syncope.  Gastrointestinal: Negative for abdominal pain.  Musculoskeletal: Negative for back pain.  Neurological: Negative  for dizziness and weakness.  All other systems reviewed and are negative.    Allergies  Review of patient's allergies indicates no known allergies.  Home Medications   Current Outpatient Rx  Name  Route  Sig  Dispense  Refill  . aspirin EC 81 MG tablet   Oral   Take 162 mg by mouth once.           BP 112/83  Pulse 63  Temp(Src) 98.2 F (36.8 C)  Resp 13  SpO2 100%  Physical Exam CONSTITUTIONAL: Well developed/well nourished HEAD: Normocephalic/atraumatic EYES: EOMI/PERRL ENMT: Mucous membranes moist NECK: supple no meningeal signs SPINE:entire spine nontender CV: S1/S2 noted, no murmurs/rubs/gallops noted LUNGS: Lungs are clear to auscultation bilaterally, no apparent distress ABDOMEN: soft, nontender, no rebound or guarding GU:no cva tenderness NEURO: Pt is awake/alert, moves all extremitiesx4 EXTREMITIES: pulses normal, full ROM, no calf tenderness, no LE edema SKIN: warm, color normal PSYCH: no abnormalities of mood noted  ED Course  Procedures  Labs Reviewed  CBC  BASIC METABOLIC PANEL  POCT I-STAT TROPONIN I   Dg Chest 2 View  06/13/2012  *RADIOLOGY REPORT*  Clinical Data: Chest pain with left shoulder tightness.  Smoker.  CHEST - 2 VIEW  Comparison: Radiographs 10/16/2011.  Findings: The heart size and mediastinal contours are stable.  The lungs are clear.  There  is mild chronic central airway thickening. There is no hyperinflation, pleural effusion or pneumothorax. Osseous structures appear normal.  IMPRESSION: Stable mild chronic central airway thickening.  No acute cardiopulmonary process.   Original Report Authenticated By: Carey Bullocks, M.D.    Suspicion for ACS/PE/dissection is low He is well appearing watching TV I feel he is stable for d/c Advised need for followup and we discussed strict return precautions Troponin not used in clinical decision making do not feel further testing needed  MDM  Nursing notes including past medical history and  social history reviewed and considered in documentation xrays reviewed and considered Labs/vital reviewed and considered        Date: 06/13/2012  Rate: 86  Rhythm: normal sinus rhythm  QRS Axis: normal  Intervals: normal  ST/T Wave abnormalities: normal  Conduction Disutrbances:none  Narrative Interpretation:   Old EKG Reviewed: none available at time of interpretation    Joya Gaskins, MD 06/13/12 1213

## 2012-06-13 NOTE — ED Notes (Signed)
Pt reports left sided chest pain onset Friday. Pt reports pain intermittently and reports being under stress recently. Pt denies any other symptoms. Pt denies pain at present.

## 2012-10-12 ENCOUNTER — Ambulatory Visit (INDEPENDENT_AMBULATORY_CARE_PROVIDER_SITE_OTHER): Payer: BC Managed Care – PPO | Admitting: Family Medicine

## 2012-10-12 ENCOUNTER — Encounter: Payer: Self-pay | Admitting: Family Medicine

## 2012-10-12 ENCOUNTER — Ambulatory Visit: Payer: BC Managed Care – PPO | Admitting: Family Medicine

## 2012-10-12 VITALS — BP 114/84 | HR 76 | Temp 98.3°F | Wt 191.0 lb

## 2012-10-12 DIAGNOSIS — R0989 Other specified symptoms and signs involving the circulatory and respiratory systems: Secondary | ICD-10-CM

## 2012-10-12 DIAGNOSIS — F411 Generalized anxiety disorder: Secondary | ICD-10-CM

## 2012-10-12 DIAGNOSIS — R079 Chest pain, unspecified: Secondary | ICD-10-CM

## 2012-10-12 DIAGNOSIS — Z87891 Personal history of nicotine dependence: Secondary | ICD-10-CM | POA: Insufficient documentation

## 2012-10-12 DIAGNOSIS — F17200 Nicotine dependence, unspecified, uncomplicated: Secondary | ICD-10-CM

## 2012-10-12 MED ORDER — CITALOPRAM HYDROBROMIDE 10 MG PO TABS: 10.0000 mg | ORAL_TABLET | Freq: Every day | ORAL | Status: DC

## 2012-10-12 MED ORDER — LORAZEPAM 0.5 MG PO TABS: 0.5000 mg | ORAL_TABLET | Freq: Two times a day (BID) | ORAL | Status: DC | PRN

## 2012-10-12 NOTE — Patient Instructions (Addendum)
Pass by Marion's office for referral to heart doctor to discuss possible treadmill test. Let's start celexa 10mg  daily - will take 3-4 weeks to get in your system. May use ativan or lorazepam 0.5mg  as needed for overwhelming stress. I do think a lot of this is coming from your stress level - so work on healthy ways of relieving stress (exercising, relaxation techniques, reading a good book or listening to music you enjoy). Coarse breath sounds today - likely from recent chest cold.  If not improving, let me know.  Work on quitting smoking.  Stress Management Stress is a state of physical or mental tension that often results from changes in your life or normal routine. Some common causes of stress are:  Death of a loved one.  Injuries or severe illnesses.  Getting fired or changing jobs.  Moving into a new home. Other causes may be:  Sexual problems.  Business or financial losses.  Taking on a large debt.  Regular conflict with someone at home or at work.  Constant tiredness from lack of sleep. It is not just bad things that are stressful. It may be stressful to:  Win the lottery.  Get married.  Buy a new car. The amount of stress that can be easily tolerated varies from person to person. Changes generally cause stress, regardless of the types of change. Too much stress can affect your health. It may lead to physical or emotional problems. Too little stress (boredom) may also become stressful. SUGGESTIONS TO REDUCE STRESS:  Talk things over with your family and friends. It often is helpful to share your concerns and worries. If you feel your problem is serious, you may want to get help from a professional counselor.  Consider your problems one at a time instead of lumping them all together. Trying to take care of everything at once may seem impossible. List all the things you need to do and then start with the most important one. Set a goal to accomplish 2 or 3 things each day.  If you expect to do too many in a single day you will naturally fail, causing you to feel even more stressed.  Do not use alcohol or drugs to relieve stress. Although you may feel better for a short time, they do not remove the problems that caused the stress. They can also be habit forming.  Exercise regularly - at least 3 times per week. Physical exercise can help to relieve that "uptight" feeling and will relax you.  The shortest distance between despair and hope is often a good night's sleep.  Go to bed and get up on time allowing yourself time for appointments without being rushed.  Take a short "time-out" period from any stressful situation that occurs during the day. Close your eyes and take some deep breaths. Starting with the muscles in your face, tense them, hold it for a few seconds, then relax. Repeat this with the muscles in your neck, shoulders, hand, stomach, back and legs.  Take good care of yourself. Eat a balanced diet and get plenty of rest.  Schedule time for having fun. Take a break from your daily routine to relax. HOME CARE INSTRUCTIONS   Call if you feel overwhelmed by your problems and feel you can no longer manage them on your own.  Return immediately if you feel like hurting yourself or someone else. Document Released: 08/11/2000 Document Revised: 05/10/2011 Document Reviewed: 04/03/2007 [facility] Patient Information 2014 [place], [place].

## 2012-10-12 NOTE — Assessment & Plan Note (Signed)
Continue to encourage cessation. 

## 2012-10-12 NOTE — Progress Notes (Signed)
  Subjective:    Patient ID: Justin Webb, male    DOB: [birth year], 37 y.o.   MRN: [id number]  HPI CC: chest pain, anxiety  Justin Webb is [place] 37 yo male some day smoker with h/o childhood asthma who presents today with persistent left sided chest discomfort.  Describes left sided tightness/pressure sensation associated with left arm heaviness.  Comes on intermittently during the day - mostly at rest.  Not related with exertion (however doesn't exercise).  Ibuprofen may have helped in the past.  Not reproducible with palpation.    Evaluated here 09/2011 with chest pain thought noncardiac in origin but rather MSK.  Recommended continue ibuprofen.  Has also been evaluated at ER x2 for this complaint over the last year.  Remains worried about cardiac cause.  Both times were associated with extra stressful period of time.  Recent increase in stress and anxiety - promotion at work has led to increased responsibilities, 11 hour days.  Trouble sleeping at night - multiple awakenings.  Excessive worrying.  Denies anxiety attacks. Lives with wife, no stress at home.   Denies depression.  No anhedonia.    Smoker - 4-6 cig/day.  Prior heavy smoker. fmhx cad "slight MI in Mom" at 40s. Sister with h/o anxiety.  Currently getting over chest cold.  Past Medical History  Diagnosis Date  . Childhood asthma   . Pancreatitis   . Testicular torsion     left  . Kidney stone 12/2011    R   Family History  Problem Relation Age of Onset  . Healthy Father   . Heart attack Mother     slight; + smoker; Mid 40's  . Diabetes Maternal Grandmother   . Other Paternal Grandfather     Brain tumor    Review of Systems per HPI    Objective:   Physical Exam  Nursing note and vitals reviewed. Constitutional: He appears well-developed and well-nourished. No distress.  HENT:  Mouth/Throat: Oropharynx is clear and moist. No oropharyngeal exudate.  Eyes: Conjunctivae and EOM are normal. Pupils are  equal, round, and reactive to light. No scleral icterus.  Cardiovascular: Normal rate, regular rhythm, normal heart sounds and intact distal pulses.   No murmur heard. Pulmonary/Chest: Effort normal. No respiratory distress. He has no wheezes. He has rhonchi (RLL). He has no rales. He exhibits no tenderness.  Psychiatric: He has a normal mood and affect. His behavior is normal. Judgment and thought content normal.       Assessment & Plan:

## 2012-10-12 NOTE — Assessment & Plan Note (Signed)
Anticipate noncardiac cause, however he does have family history of early CAD and remains worried about cardiac cause. I recommended referral to cardiologist today for discussion on merits of exercise treadmill stress test. Pt agrees.

## 2012-10-12 NOTE — Assessment & Plan Note (Signed)
Discussed dx as well as healthy stress relieving strategies. Recommended starting celexa at 10mg  daily for next month, may use ativan prn anxiety/overwhelming feelings. rtc 1 mo for f/u. Pt agrees with plan. Pt not currently interested in counseling.

## 2012-10-12 NOTE — Assessment & Plan Note (Signed)
Pt getting over chest cold.  Recheck next visit. Recent CXR stable. (05/2012)

## 2012-10-30 HISTORY — PX: OTHER SURGICAL HISTORY: SHX169

## 2012-11-02 ENCOUNTER — Encounter: Payer: Self-pay | Admitting: Cardiovascular Disease

## 2012-11-02 ENCOUNTER — Ambulatory Visit (INDEPENDENT_AMBULATORY_CARE_PROVIDER_SITE_OTHER): Payer: BC Managed Care – PPO | Admitting: Cardiovascular Disease

## 2012-11-02 VITALS — BP 110/68 | HR 72 | Ht 73.0 in | Wt 191.0 lb

## 2012-11-02 DIAGNOSIS — F411 Generalized anxiety disorder: Secondary | ICD-10-CM

## 2012-11-02 DIAGNOSIS — R0602 Shortness of breath: Secondary | ICD-10-CM

## 2012-11-02 DIAGNOSIS — R079 Chest pain, unspecified: Secondary | ICD-10-CM

## 2012-11-02 DIAGNOSIS — F17200 Nicotine dependence, unspecified, uncomplicated: Secondary | ICD-10-CM

## 2012-11-02 NOTE — Procedures (Signed)
Exercise Treadmill Test  Treadmill ordered for recent epsiodes of chest pain.  Resting EKG shows NSR with rate of 85 bpm, no significant ST or T wave changes  Resting blood pressure of 118/82 Stand bruce protocal was used.  Patient exercised for 9 min 40 sec,  Peak heart rate of 169 bpm.  This was 91% of the maximum predicted heart rate (target heart rate 157). Achieved 12.9 METS No symptoms of chest pain or lightheadedness were reported at peak stress or in recovery.  Peak Blood pressure recorded was 160/82 Heart rate at 3 minutes in recovery was 97 bpm  FINAL IMPRESSION: Normal exercise stress test. No significant EKG changes concerning for ischemia. Excellent exercise tolerance. Chest pain symptoms in the past felt to be noncardiac in etiology.

## 2012-11-02 NOTE — Progress Notes (Signed)
   Patient ID: Justin Webb, male    DOB: 1975-08-30, 37 y.o.   MRN: [id number]  HPI Comments: Justin Webb is a 37 year old gentleman with long history of smoking, anxiety presents with episodes of chronic chest pain.  He reports that symptoms started in October of 2013. Since that time he's had numerous episodes of chest pain. He has been to the emergency room in late 2013 and April 2014. Workup at that time was negative. Cardiac enzymes were negative. No significant changes on EKG noted. He has never had a stress test. Reports that he continues to have chest pain, described as a tightness in the left pectoral region, radiating deep, to his left arm and shoulder.  Seems to happen at rest and sometimes with exertion. He works a busy job, 60 hours per week at [employer]. Recently had a promotion, bought a house. Past weekend, was moving his house with no pain on exertion.  He does report that Celexa and Ativan have helped his symptoms  EKG shows normal sinus rhythm with rate 72 beats per minute, no significant ST or T wave changes   Outpatient Encounter Prescriptions as of 11/02/2012  Medication Sig Dispense Refill  . citalopram (CELEXA) 10 MG tablet Take 1 tablet (10 mg total) by mouth daily.  30 tablet  3  . ibuprofen (ADVIL,MOTRIN) 200 MG tablet Take 200 mg by mouth every 6 (six) hours as needed for pain.      Marland Kitchen LORazepam (ATIVAN) 0.5 MG tablet Take 1 tablet (0.5 mg total) by mouth 2 (two) times daily as needed for anxiety.  30 tablet  0    Review of Systems  Constitutional: Negative.   HENT: Negative.   Eyes: Negative.   Respiratory: Negative.   Cardiovascular: Positive for chest pain.  Gastrointestinal: Negative.   Musculoskeletal: Negative.   Skin: Negative.   Neurological: Negative.   Psychiatric/Behavioral: Negative.   All other systems reviewed and are negative.    BP 110/68  Pulse 72  Ht 6\' 1"  (1.854 m)  Wt 191 lb (86.637 kg)  BMI 25.2 kg/m2  Physical Exam  Nursing  note and vitals reviewed. Constitutional: He is oriented to person, place, and time. He appears well-developed and well-nourished.  HENT:  Head: Normocephalic.  Nose: Nose normal.  Mouth/Throat: Oropharynx is clear and moist.  Eyes: Conjunctivae are normal. Pupils are equal, round, and reactive to light.  Neck: Normal range of motion. Neck supple. No JVD present.  Cardiovascular: Normal rate, regular rhythm, S1 normal, S2 normal, normal heart sounds and intact distal pulses.  Exam reveals no gallop and no friction rub.   No murmur heard. Pulmonary/Chest: Effort normal and breath sounds normal. No respiratory distress. He has no wheezes. He has no rales. He exhibits no tenderness.  Abdominal: Soft. Bowel sounds are normal. He exhibits no distension. There is no tenderness.  Musculoskeletal: Normal range of motion. He exhibits no edema and no tenderness.  Lymphadenopathy:    He has no cervical adenopathy.  Neurological: He is alert and oriented to person, place, and time. Coordination normal.  Skin: Skin is warm and dry. No rash noted. No erythema.  Psychiatric: He has a normal mood and affect. His behavior is normal. Judgment and thought content normal.      Assessment and Plan

## 2012-11-02 NOTE — Assessment & Plan Note (Signed)
He reports symptoms are better on Ativan and Celexa.  Followed by Dr. Sharen Hones.

## 2012-11-02 NOTE — Patient Instructions (Addendum)
  No medication changes were made.  We will schedule you for a stress test today  Please call us if you have new issues that need to be addressed before your next appt.

## 2012-11-02 NOTE — Assessment & Plan Note (Signed)
Atypical type chest pain. Comes on at rest, sometimes with exertion. Never had a stress test before. He does have a history of smoking. We will order a routine treadmill study to confirm no underlying ischemia.

## 2012-11-02 NOTE — Patient Instructions (Addendum)
Your treadmill stress test is normal with no suggestion of significant ischemia. Excellent exercise tolerance No further cardiac workup at this time  Please call if we can be of further assistance

## 2012-11-02 NOTE — Assessment & Plan Note (Signed)
He reports that he stopped smoking 10 days ago.

## 2012-11-03 ENCOUNTER — Encounter: Payer: Self-pay | Admitting: Family Medicine

## 2012-11-16 ENCOUNTER — Encounter: Payer: Self-pay | Admitting: Family Medicine

## 2012-11-16 ENCOUNTER — Ambulatory Visit (INDEPENDENT_AMBULATORY_CARE_PROVIDER_SITE_OTHER): Payer: BC Managed Care – PPO | Admitting: Family Medicine

## 2012-11-16 VITALS — BP 126/82 | HR 68 | Temp 98.3°F | Wt 189.8 lb

## 2012-11-16 DIAGNOSIS — Z87891 Personal history of nicotine dependence: Secondary | ICD-10-CM

## 2012-11-16 DIAGNOSIS — R079 Chest pain, unspecified: Secondary | ICD-10-CM

## 2012-11-16 DIAGNOSIS — F411 Generalized anxiety disorder: Secondary | ICD-10-CM

## 2012-11-16 MED ORDER — CITALOPRAM HYDROBROMIDE 10 MG PO TABS: 10.0000 mg | ORAL_TABLET | Freq: Every day | ORAL | Status: DC

## 2012-11-16 MED ORDER — LORAZEPAM 0.5 MG PO TABS: 0.5000 mg | ORAL_TABLET | Freq: Two times a day (BID) | ORAL | Status: DC | PRN

## 2012-11-16 NOTE — Assessment & Plan Note (Signed)
With anxiety attacks. Symptoms much improved on celexa qd/ativan prn. Continue meds as up to now. RTC PRN.

## 2012-11-16 NOTE — Progress Notes (Signed)
  Subjective:    Patient ID: Justin Webb, male    DOB: [birth year], 37 y.o.   MRN: [id number]  HPI CC: 1 mo f/u   I asked Justin Webb to return for 1 mo f/u of chest pain epsiodes he was having thought largely related to stress/anxiety.  I did refer him to cards given recurrence of episodes and his concern about cardiac cause.  He saw Dr. Mariah Milling and underwent a normal stress test with excellent exercise tolerance.  We also started celexa at 10mg  daily and ativan PRN anxiety at last visit.  These have significantly helped.  Would like to continue.  Using ativan only as needed, several days at a time doesn't use.  Quit smoking!  Prior 1/4 ppd.  Abstinent in the last week.  Feels good control of anxiety has led to ease with quitting smoking.  Past Medical History  Diagnosis Date  . Childhood asthma   . Pancreatitis   . Testicular torsion 1995    left  . Kidney stone 12/2011    R  . Smoker    Past Surgical History  Procedure Laterality Date  . Testicular torsion  1995    left  . Exercise treadmill  10/2012    WNL, excellent exercise tolerance     Review of Systems Per HPI    Objective:   Physical Exam  Nursing note and vitals reviewed. Constitutional: He appears well-developed and well-nourished. No distress.  HENT:  Mouth/Throat: Oropharynx is clear and moist. No oropharyngeal exudate.  Cardiovascular: Normal rate, regular rhythm, normal heart sounds and intact distal pulses.   No murmur heard. Pulmonary/Chest: Effort normal and breath sounds normal. No respiratory distress. He has no wheezes. He has no rales.  Musculoskeletal: He exhibits no edema.  Skin: Skin is warm and dry. No rash noted.  Psychiatric: He has a normal mood and affect.       Assessment & Plan:

## 2012-11-16 NOTE — Assessment & Plan Note (Signed)
Improved with better control of anxiety.

## 2012-11-16 NOTE — Assessment & Plan Note (Signed)
Congratulated on quitting smoking for last week! Encouraged remain abstinent, rec think of himself as a "non smoker"

## 2012-11-16 NOTE — Patient Instructions (Addendum)
Continue meds as up to now - continue celexa (citalopram 10mg  daily).. Try to space out ativan (lorazepam 0.5mg  as needed). Good to see you today, call us with questions.

## 2013-05-07 ENCOUNTER — Ambulatory Visit (INDEPENDENT_AMBULATORY_CARE_PROVIDER_SITE_OTHER)
Admission: RE | Admit: 2013-05-07 | Discharge: 2013-05-07 | Disposition: A | Discharge status: Home / Self Care | Payer: No Typology Code available for payment source | Source: Ambulatory Visit | Attending: Family Medicine | Admitting: Family Medicine

## 2013-05-07 ENCOUNTER — Encounter: Payer: Self-pay | Admitting: Family Medicine

## 2013-05-07 ENCOUNTER — Ambulatory Visit (INDEPENDENT_AMBULATORY_CARE_PROVIDER_SITE_OTHER): Payer: No Typology Code available for payment source | Admitting: Family Medicine

## 2013-05-07 VITALS — BP 128/84 | HR 88 | Temp 97.9°F | Wt 212.0 lb

## 2013-05-07 DIAGNOSIS — R079 Chest pain, unspecified: Secondary | ICD-10-CM

## 2013-05-07 DIAGNOSIS — Z87891 Personal history of nicotine dependence: Secondary | ICD-10-CM

## 2013-05-07 DIAGNOSIS — F411 Generalized anxiety disorder: Secondary | ICD-10-CM

## 2013-05-07 MED ORDER — LORAZEPAM 0.5 MG PO TABS: 0.5000 mg | ORAL_TABLET | Freq: Two times a day (BID) | ORAL | Status: DC | PRN

## 2013-05-07 NOTE — Assessment & Plan Note (Addendum)
Atypical - anticipate musculoskeletal vs anxiety related. Given lung findings, check CXR to eval for chronic pulm disease and follow prior chronic bronchitic changes in h/o smoker. Pt agrees.

## 2013-05-07 NOTE — Assessment & Plan Note (Signed)
Update CXR today given new sxs.  Continue to encourage cessation.

## 2013-05-07 NOTE — Assessment & Plan Note (Signed)
Persistent issue with work stress although overall improved compared to last year - refilled ativan to use prn. Discussed option of restarting zoloft - pt will update me if this is desired in setting of persistent anxiety.

## 2013-05-07 NOTE — Progress Notes (Signed)
   BP 128/84  Pulse 88  Temp(Src) 97.9 F (36.6 C) (Oral)  Wt 212 lb (96.163 kg)   CC: chest pain  Subjective:    Patient ID: Justin Webb, male    DOB: 10/20/1975, 38 y.o.   MRN: [id number]  HPI: Justin Webb is a 38 y.o. male presenting on 05/07/2013 with Chest Pain   See prior notes for details. Briefly, h/o chest pain and anxiety with normal exercise treadmill that prior resolved with celexa and ativan treatment - with better control of anxiety. Since he's stopped celexa (made him too sleepy regardless of taking in am or at night) and rare ativan use. Ativan does help.  Feels anxiety overall doing well.  Persistent intermittent chest discomfort described as dull ache left sternum. None present today. Not reproducible. Not anxiety related. No GERD sxs. Improved when laying down. Denies recent inciting injury or heavy lifting. No dyspnea, wheeze. No fevers/chills.  Occasional cough minimally productive.  H/o childhood asthma.  Quit smoking late last year. Occasional smoker now (rare).  Exercise treadmill: Date: 10/2012 WNL, excellent exercise tolerance ([place]).   Relevant past medical, surgical, family and social history reviewed and updated as indicated.  Allergies and medications reviewed and updated. Current Outpatient Prescriptions on File Prior to Visit  Medication Sig  . ibuprofen (ADVIL,MOTRIN) 200 MG tablet Take 200 mg by mouth every 6 (six) hours as needed for pain.   No current facility-administered medications on file prior to visit.    Review of Systems Per HPI unless specifically indicated above    Objective:    BP 128/84  Pulse 88  Temp(Src) 97.9 F (36.6 C) (Oral)  Wt 212 lb (96.163 kg)  Physical Exam  Nursing note and vitals reviewed. Constitutional: He appears well-developed and well-nourished. No distress.  HENT:  Mouth/Throat: Oropharynx is clear and moist. No oropharyngeal exudate.  Cardiovascular: Normal rate, regular rhythm, normal  heart sounds and intact distal pulses.   No murmur heard. Pulmonary/Chest: Effort normal. No respiratory distress. He has no wheezes. He has rhonchi (R>L central, clear with cough). He has no rales. He exhibits no tenderness.  Coarse breath sounds  Musculoskeletal: He exhibits no edema.  Skin: Skin is warm and dry. No rash noted.       Assessment & Plan:   Problem List Items Addressed This Visit   Anxiety state, unspecified     Persistent issue with work stress although overall improved compared to last year - refilled ativan to use prn. Discussed option of restarting zoloft - pt will update me if this is desired in setting of persistent anxiety.    Relevant Medications      LORazepam (ATIVAN) tablet   Chest pain - Primary     Atypical - anticipate musculoskeletal vs anxiety related. Given lung findings, check CXR to eval for chronic pulm disease and follow prior chronic bronchitic changes in h/o smoker. Pt agrees.    Relevant Orders      DG Chest 2 View   Smoker     Update CXR today given new sxs.  Continue to encourage cessation.        Follow up plan: Return if symptoms worsen or fail to improve.

## 2013-05-07 NOTE — Progress Notes (Signed)
Pre visit review using our clinic review tool, if applicable. No additional management support is needed unless otherwise documented below in the visit note. 

## 2013-05-07 NOTE — Patient Instructions (Signed)
I've refilled ativan to use as needed for anxiety.   If not improving I recommend starting daily anxiety medicine called zoloft - call me if not improving. Let's repeat xray today.  Continue to work on quitting smoking. Good to see you today.

## 2014-06-27 ENCOUNTER — Ambulatory Visit (INDEPENDENT_AMBULATORY_CARE_PROVIDER_SITE_OTHER): Payer: No Typology Code available for payment source | Admitting: Family Medicine

## 2014-06-27 ENCOUNTER — Encounter: Payer: Self-pay | Admitting: Family Medicine

## 2014-06-27 ENCOUNTER — Encounter: Payer: Self-pay | Admitting: *Deleted

## 2014-06-27 VITALS — BP 124/70 | HR 64 | Temp 97.9°F | Wt 194.0 lb

## 2014-06-27 DIAGNOSIS — F411 Generalized anxiety disorder: Secondary | ICD-10-CM

## 2014-06-27 DIAGNOSIS — N410 Acute prostatitis: Secondary | ICD-10-CM | POA: Diagnosis not present

## 2014-06-27 LAB — POCT URINALYSIS DIPSTICK
Bilirubin, UA: NEGATIVE
Blood, UA: NEGATIVE
Glucose, UA: NEGATIVE
Ketones, UA: NEGATIVE
Leukocytes, UA: NEGATIVE
Nitrite, UA: NEGATIVE
Protein, UA: NEGATIVE
Spec Grav, UA: >=1.03
Urobilinogen, UA: 0.2
pH, UA: 6

## 2014-06-27 MED ORDER — LORAZEPAM 0.5 MG PO TABS: 0.5000 mg | ORAL_TABLET | Freq: Two times a day (BID) | ORAL | Status: DC | PRN

## 2014-06-27 MED ORDER — NAPROXEN 500 MG PO TABS: ORAL_TABLET | ORAL | Status: DC

## 2014-06-27 MED ORDER — SULFAMETHOXAZOLE-TRIMETHOPRIM 800-160 MG PO TABS: 1.0000 | ORAL_TABLET | Freq: Two times a day (BID) | ORAL | Status: DC

## 2014-06-27 NOTE — Patient Instructions (Addendum)
Possible kidney stone vs prostate infection - treat with anti inflammatory.  If not improving or fever or more urinary symptoms, fill antibiotic provided today. Push fluids and rest. Let us know if not improving with treatment or any new symptoms.

## 2014-06-27 NOTE — Progress Notes (Signed)
   BP 124/70 mmHg  Pulse 64  Temp(Src) 97.9 F (36.6 C) (Tympanic)  Wt 194 lb (87.998 kg)   CC: lower back pain  Subjective:    Patient ID: Justin Webb, male    DOB: 08/05/1975, 39 y.o.   MRN: [id number]  HPI: Justin SalenWilliam Onofrio Webb is a 39 y.o. male presenting on 06/27/2014 for Urinary Tract Infection   LBP below B flanks ongoing for last 5 days. Painful with standing. Yesterday pain worsened. Some urinary urgency with difficulty voiding. Started after increased walking over weekend. Denies inciting trauma/injury.   No dysuria, frequency, fevers/chills, abd pain, nausea/vomiting. No blood in urine. Fully empties. No rectal pain. No diarrhea/constipation.  H/o pancreatitis and kidney stone, this feels different. CT 12/2011 - 1mm nonobstructive L distal ureteral calculus, nonobstructive small R renal collecting system calculi. Ex smoker (2014).   Relevant past medical, surgical, family and social history reviewed and updated as indicated. Interim medical history since our last visit reviewed. Allergies and medications reviewed and updated. Current Outpatient Prescriptions on File Prior to Visit  Medication Sig  . ibuprofen (ADVIL,MOTRIN) 200 MG tablet Take 200 mg by mouth every 6 (six) hours as needed for pain.   No current facility-administered medications on file prior to visit.    Review of Systems Per HPI unless specifically indicated above     Objective:    BP 124/70 mmHg  Pulse 64  Temp(Src) 97.9 F (36.6 C) (Tympanic)  Wt 194 lb (87.998 kg)  Wt Readings from Last 3 Encounters:  06/27/14 194 lb (87.998 kg)  05/07/13 212 lb (96.163 kg)  11/16/12 189 lb 12 oz (86.07 kg)    Physical Exam  Constitutional: He appears well-developed and well-nourished. No distress.  HENT:  Mouth/Throat: Oropharynx is clear and moist. No oropharyngeal exudate.  Abdominal: Soft. Normal appearance and bowel sounds are normal. He exhibits no distension and no mass. There is no  hepatosplenomegaly. There is no tenderness. There is CVA tenderness (mild L). There is no rigidity, no rebound, no guarding and negative Murphy's sign.  Genitourinary: Rectum normal and prostate normal. Rectal exam shows no external hemorrhoid, no internal hemorrhoid, no fissure, no mass, no tenderness and anal tone normal. Prostate is not enlarged (20gm) and not tender.  Musculoskeletal: He exhibits no edema.  Skin: Skin is warm and dry. No rash noted.  Psychiatric: He has a normal mood and affect.  Nursing note and vitals reviewed.     Assessment & Plan:   Problem List Items Addressed This Visit    Anxiety state    Rare ativan use, refilled today.      Relevant Medications   LORazepam (ATIVAN) 0.5 MG tablet   Acute prostatitis - Primary    UA clear, but given sxs anticipate kidney stone vs subacute prostatitis.  Prostate exam normal - will treat for kidney stone with naprosyn 500mg  bid x 1 wk with food then prn. If fever or persistent urinary sxs, provided with WASP for bactrim 2wk course to take for possible prostate infection. Push fluids and rest. Pt agrees with plan.          Follow up plan: Return if symptoms worsen or fail to improve.

## 2014-06-27 NOTE — Assessment & Plan Note (Signed)
Rare ativan use, refilled today.

## 2014-06-27 NOTE — Progress Notes (Signed)
Pre visit review using our clinic review tool, if applicable. No additional management support is needed unless otherwise documented below in the visit note. 

## 2014-06-27 NOTE — Addendum Note (Signed)
Addended by: Josph MachoANCE, Tamzin Bertling A on: 06/27/2014 01:31 PM   Modules accepted: Orders

## 2014-06-27 NOTE — Assessment & Plan Note (Signed)
UA clear, but given sxs anticipate kidney stone vs subacute prostatitis.  Prostate exam normal - will treat for kidney stone with naprosyn 500mg  bid x 1 wk with food then prn. If fever or persistent urinary sxs, provided with WASP for bactrim 2wk course to take for possible prostate infection. Push fluids and rest. Pt agrees with plan.

## 2014-07-23 ENCOUNTER — Encounter: Payer: Self-pay | Admitting: Family Medicine

## 2014-07-23 ENCOUNTER — Ambulatory Visit (INDEPENDENT_AMBULATORY_CARE_PROVIDER_SITE_OTHER): Payer: No Typology Code available for payment source | Admitting: Family Medicine

## 2014-07-23 VITALS — BP 108/62 | HR 57 | Temp 98.0°F | Wt 196.0 lb

## 2014-07-23 DIAGNOSIS — J029 Acute pharyngitis, unspecified: Secondary | ICD-10-CM | POA: Diagnosis not present

## 2014-07-23 NOTE — Progress Notes (Signed)
   BP 108/62 mmHg  Pulse 57  Temp(Src) 98 F (36.7 C) (Oral)  Wt 196 lb (88.905 kg)  SpO2 97%   CC: ST x 1 d  Subjective:    Patient ID: Justin Webb, male    DOB: 05/01/1975, 39 y.o.   MRN: [id number]  HPI: Justin Webb is a 39 y.o. male presenting on 07/23/2014 for Sore Throat   Naprosyn took care of prior sxs.  Presents today with 1d h/o "uncomfortable feeling in left throat". "doesn't feel right". Denies drainage, congestion, coughing, sneezing, ear or tooth pain. Possible mild soreness at left jaw when opening mouth. Has not felt any swollen glands.  Has tried nothing for this.   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 Doesn't really have h/o seasonal allergies.  Relevant past medical, surgical, family and social history reviewed and updated as indicated. Interim medical history since our last visit reviewed. Allergies and medications reviewed and updated. Current Outpatient Prescriptions on File Prior to Visit  Medication Sig  . ibuprofen (ADVIL,MOTRIN) 200 MG tablet Take 200 mg by mouth every 6 (six) hours as needed for pain.  Marland Kitchen. LORazepam (ATIVAN) 0.5 MG tablet Take 1 tablet (0.5 mg total) by mouth 2 (two) times daily as needed for anxiety.   No current facility-administered medications on file prior to visit.    Review of Systems Per HPI unless specifically indicated above     Objective:    BP 108/62 mmHg  Pulse 57  Temp(Src) 98 F (36.7 C) (Oral)  Wt 196 lb (88.905 kg)  SpO2 97%  Wt Readings from Last 3 Encounters:  07/23/14 196 lb (88.905 kg)  06/27/14 194 lb (87.998 kg)  05/07/13 212 lb (96.163 kg)    Physical Exam  Constitutional: He appears well-developed and well-nourished. No distress.  HENT:  Head: Normocephalic and atraumatic.  Right Ear: Hearing, tympanic membrane, external ear and ear canal normal.  Left Ear: Hearing, tympanic membrane, external ear and ear canal normal.  Nose: Nose normal.  No mucosal edema or rhinorrhea. Right sinus exhibits no maxillary sinus tenderness and no frontal sinus tenderness. Left sinus exhibits no maxillary sinus tenderness and no frontal sinus tenderness.  Mouth/Throat: Uvula is midline, oropharynx is clear and moist and mucous membranes are normal. No oropharyngeal exudate, posterior oropharyngeal edema, posterior oropharyngeal erythema or tonsillar abscesses.  Eyes: Conjunctivae and EOM are normal. Pupils are equal, round, and reactive to light. No scleral icterus.  Neck: Normal range of motion. Neck supple. No thyromegaly present.  Cardiovascular: Normal rate, regular rhythm, normal heart sounds and intact distal pulses.   No murmur heard. Pulmonary/Chest: Effort normal and breath sounds normal. No respiratory distress. He has no wheezes. He has no rales.  Lymphadenopathy:    He has cervical adenopathy (shotty nontender R jugulodigastric LN).  Skin: Skin is warm and dry. No rash noted.  Nursing note and vitals reviewed.     Assessment & Plan:   Problem List Items Addressed This Visit    Acute pharyngitis - Primary    Anticipate beginning of pharyngitis (viral vs irritant) Discussed this. Supportive care recommended. Has been using EmergenC preventatively.          Follow up plan: Return if symptoms worsen or fail to improve.

## 2014-07-23 NOTE — Progress Notes (Signed)
Pre visit review using our clinic review tool, if applicable. No additional management support is needed unless otherwise documented below in the visit note. 

## 2014-07-23 NOTE — Patient Instructions (Signed)
Exam ok today. Watch symptoms for now. Treat with popsicles, herbal tea, honey with lemon or salt water gargles for next few days

## 2014-07-23 NOTE — Assessment & Plan Note (Signed)
Anticipate beginning of pharyngitis (viral vs irritant) Discussed this. Supportive care recommended. Has been using EmergenC preventatively.

## 2014-11-25 ENCOUNTER — Other Ambulatory Visit: Payer: Self-pay | Admitting: Family Medicine

## 2014-11-25 NOTE — Telephone Encounter (Signed)
Ok to refill 

## 2014-11-25 NOTE — Telephone Encounter (Signed)
Rx called in as directed.   

## 2014-11-25 NOTE — Telephone Encounter (Signed)
plz phone in. 

## 2015-04-28 ENCOUNTER — Other Ambulatory Visit: Payer: Self-pay | Admitting: Family Medicine

## 2015-04-28 NOTE — Telephone Encounter (Signed)
Received refill request electronically Last refill 11/25/14 #30 Last office visit 07/23/14 Is it okay to refill?

## 2015-04-29 NOTE — Telephone Encounter (Signed)
Rx called in as directed.   

## 2015-04-29 NOTE — Telephone Encounter (Signed)
plz phone in. 

## 2015-07-22 ENCOUNTER — Ambulatory Visit (INDEPENDENT_AMBULATORY_CARE_PROVIDER_SITE_OTHER): Payer: BLUE CROSS/BLUE SHIELD | Admitting: Family Medicine

## 2015-07-22 ENCOUNTER — Encounter: Payer: Self-pay | Admitting: Family Medicine

## 2015-07-22 VITALS — BP 118/82 | HR 64 | Temp 98.3°F | Wt 204.0 lb

## 2015-07-22 DIAGNOSIS — H02843 Edema of right eye, unspecified eyelid: Secondary | ICD-10-CM

## 2015-07-22 NOTE — Assessment & Plan Note (Signed)
Anticipate early hordeolum. Treat with warm compresses. Anticipate quick recovery. If not improving, discussed ophtho referral. Red flags to seek further care reviewed. Pt agrees with plan. Passes snellen.

## 2015-07-22 NOTE — Progress Notes (Signed)
   BP 118/82 mmHg  Pulse 64  Temp(Src) 98.3 F (36.8 C) (Oral)  Wt 204 lb (92.534 kg)   CC: R eye pain/swelling  Subjective:    Patient ID: Justin Webb, male    DOB: 01/29/1976, 40 y.o.   MRN: [id number]  HPI: Justin Webb is a 40 y.o. male presenting on 07/22/2015 for Eye Problem   2d ago noticed R eyelid become swollen/puffy. Initially scratching of eye. Increased watering as well. Lower eyelid feeling sore.   Today feeling some better.  No vision changes.  No photophobia.  No fevers/chills, headache.  No viral URI sxs.  No prior h/o eye infection or problems He is not a contact wearer.  Relevant past medical, surgical, family and social history reviewed and updated as indicated. Interim medical history since our last visit reviewed. Allergies and medications reviewed and updated. Current Outpatient Prescriptions on File Prior to Visit  Medication Sig  . ibuprofen (ADVIL,MOTRIN) 200 MG tablet Take 200 mg by mouth every 6 (six) hours as needed for pain.  Marland Kitchen. LORazepam (ATIVAN) 0.5 MG tablet TAKE 1 TABLET BY MOUTH TWICE A DAY   No current facility-administered medications on file prior to visit.    Review of Systems Per HPI unless specifically indicated in ROS section     Objective:    BP 118/82 mmHg  Pulse 64  Temp(Src) 98.3 F (36.8 C) (Oral)  Wt 204 lb (92.534 kg)  Wt Readings from Last 3 Encounters:  07/22/15 204 lb (92.534 kg)  07/23/14 196 lb (88.905 kg)  06/27/14 194 lb (87.998 kg)    Physical Exam  Constitutional: He appears well-developed and well-nourished. No distress.  Eyes: EOM are normal. Pupils are equal, round, and reactive to light. Right eye exhibits no discharge. Left eye exhibits no discharge. Right conjunctiva is injected. Right conjunctiva has no hemorrhage. Left conjunctiva is not injected. Left conjunctiva has no hemorrhage.  Mild swelling with erythema of right upper > lower eyelids  Right lid everted - no inflammation under  superior eyelid Brisk optic disc on right No significant bulbar conjuctival injection, + palpebral injection inferiorly  Psychiatric: He has a normal mood and affect.  Nursing note and vitals reviewed.     Assessment & Plan:   Problem List Items Addressed This Visit    Swelling of right eyelid - Primary    Anticipate early hordeolum. Treat with warm compresses. Anticipate quick recovery. If not improving, discussed ophtho referral. Red flags to seek further care reviewed. Pt agrees with plan. Passes snellen.          Follow up plan: No Follow-up on file.  Eustaquio BoydenJavier Shant Hence, MD

## 2015-07-22 NOTE — Progress Notes (Signed)
Pre visit review using our clinic review tool, if applicable. No additional management support is needed unless otherwise documented below in the visit note. 

## 2015-07-22 NOTE — Patient Instructions (Signed)
Snellen today This is likely early stages of an external hordeolum also known as a stye Treat with warm compresses.  If worsening pain/swelling, call me for antibiotic eye drops. If not improving over next 2 weeks, let us know for referral to eye doctor as you may need to get it drained.   Stye A stye is a bump on your eyelid caused by a bacterial infection. A stye can form inside the eyelid (internal stye) or outside the eyelid (external stye). An internal stye may be caused by an infected oil-producing gland inside your eyelid. An external stye may be caused by an infection at the base of your eyelash (hair follicle). Styes are very common. Anyone can get them at any age. They usually occur in just one eye, but you may have more than one in either eye.  CAUSES  The infection is almost always caused by bacteria called Staphylococcus aureus. This is a common type of bacteria that lives on your skin. RISK FACTORS You may be at higher risk for a stye if you have had one before. You may also be at higher risk if you have:  Diabetes.  Long-term illness.  Long-term eye redness.  A skin condition called seborrhea.  High fat levels in your blood (lipids). SIGNS AND SYMPTOMS  Eyelid pain is the most common symptom of a stye. Internal styes are more painful than external styes. Other signs and symptoms may include:  Painful swelling of your eyelid.  A scratchy feeling in your eye.  Tearing and redness of your eye.  Pus draining from the stye. DIAGNOSIS  Your health care provider may be able to diagnose a stye just by examining your eye. The health care provider may also check to make sure:  You do not have a fever or other signs of a more serious infection.  The infection has not spread to other parts of your eye or areas around your eye. TREATMENT  Most styes will clear up in a few days without treatment. In some cases, you may need to use antibiotic drops or ointment to prevent  infection. Your health care provider may have to drain the stye surgically if your stye is:  Large.  Causing a lot of pain.  Interfering with your vision. This can be done using a thin blade or a needle.  HOME CARE INSTRUCTIONS   Take medicines only as directed by your health care provider.  Apply a clean, warm compress to your eye for 10 minutes, 4 times a day.  Do not wear contact lenses or eye makeup until your stye has healed.  Do not try to pop or drain the stye. SEEK MEDICAL CARE IF:  You have chills or a fever.  Your stye does not go away after several days.  Your stye affects your vision.  Your eyeball becomes swollen, red, or painful. MAKE SURE YOU:  Understand these instructions.  Will watch your condition.  Will get help right away if you are not doing well or get worse.   This information is not intended to replace advice given to you by your health care provider. Make sure you discuss any questions you have with your health care provider.   Document Released: 11/25/2004 Document Revised: 03/08/2014 Document Reviewed: 06/01/2013 Elsevier Interactive Patient Education [employer]2016 [employer]

## 2015-10-28 ENCOUNTER — Other Ambulatory Visit: Payer: Self-pay | Admitting: Family Medicine

## 2015-10-28 NOTE — Telephone Encounter (Signed)
Ok to refill? Last filled 04/29/15 #30 0RF

## 2015-10-29 NOTE — Telephone Encounter (Signed)
plz phone in. 

## 2015-10-29 NOTE — Telephone Encounter (Signed)
Rx called in as directed.   

## 2015-12-25 ENCOUNTER — Encounter: Payer: Self-pay | Admitting: Family Medicine

## 2015-12-25 ENCOUNTER — Ambulatory Visit (INDEPENDENT_AMBULATORY_CARE_PROVIDER_SITE_OTHER): Payer: BLUE CROSS/BLUE SHIELD | Admitting: Family Medicine

## 2015-12-25 VITALS — BP 108/72 | HR 76 | Temp 98.2°F | Wt 200.0 lb

## 2015-12-25 DIAGNOSIS — R5383 Other fatigue: Secondary | ICD-10-CM

## 2015-12-25 DIAGNOSIS — M542 Cervicalgia: Secondary | ICD-10-CM | POA: Diagnosis not present

## 2015-12-25 DIAGNOSIS — Z23 Encounter for immunization: Secondary | ICD-10-CM | POA: Diagnosis not present

## 2015-12-25 DIAGNOSIS — E041 Nontoxic single thyroid nodule: Secondary | ICD-10-CM | POA: Diagnosis not present

## 2015-12-25 NOTE — Progress Notes (Signed)
Pre visit review using our clinic review tool, if applicable. No additional management support is needed unless otherwise documented below in the visit note. 

## 2015-12-25 NOTE — Patient Instructions (Addendum)
Flu shot today Blood work today. Exam overall ok today.  We will schedule thyroid ultrasound as well.  Work on increased exercise in your routine for stress relieving strategy.

## 2015-12-25 NOTE — Assessment & Plan Note (Addendum)
Benign exam today.  Anticipate MSK strain.  rec heating pad, gentle stretching.  With concomitant fatigue and possible R thyroid nodule, check thyroid US, labs (TSH, BMP, CBC). Will update with results.

## 2015-12-25 NOTE — Progress Notes (Signed)
BP 108/72 (BP Location: Left Arm, Patient Position: Sitting, Cuff Size: Large)   Pulse 76   Temp 98.2 F (36.8 C) (Oral)   Wt 200 lb (90.7 kg)   SpO2 97%   BMI 26.39 kg/m    CC: L neck pain Subjective:    Patient ID: Justin Webb, male    DOB: 04/13/1975, 40 y.o.   MRN: [id number]  HPI: Justin SalenWilliam Evers Webb is a 40 y.o. male presenting on 12/25/2015 for Neck Pain (Left side for about 2 weeks intermittent. No radiating pain Has had full cardiac workup in the past. Chiropractor has told him his neck is out of alignment)   Several week h/o L nck pain without radiation. Initially ?ST treated with emergen-C and this improved. Over last 2 wks L neck pain returning. Intermittent throbbing ache. No posterior midline neck pain.   Noticing more trouble sleeping at night due to L shoulder ache.  Has had normal cardiology evaluation with normal exercise treadmill test.  Malaise and fatigue present for last week. Appetite down. Looser stools.   More stress at work over last 2 months.   No fevers/chills, coughing, chest pain, abd pain, nausea/vomiting, diarrhea. No ear pain, HA, PNdrainage.  Did have L lower tooth pulled 09/2015 after root canal did not take.   Relevant past medical, surgical, family and social history reviewed and updated as indicated. Interim medical history since our last visit reviewed. Allergies and medications reviewed and updated. Current Outpatient Prescriptions on File Prior to Visit  Medication Sig  . ibuprofen (ADVIL,MOTRIN) 200 MG tablet Take 200 mg by mouth every 6 (six) hours as needed for pain.  Marland Kitchen. LORazepam (ATIVAN) 0.5 MG tablet TAKE 1 TABLET BY MOUTH TWICE A DAY   No current facility-administered medications on file prior to visit.     Review of Systems Per HPI unless specifically indicated in ROS section     Objective:    BP 108/72 (BP Location: Left Arm, Patient Position: Sitting, Cuff Size: Large)   Pulse 76   Temp 98.2 F (36.8 C)  (Oral)   Wt 200 lb (90.7 kg)   SpO2 97%   BMI 26.39 kg/m   Wt Readings from Last 3 Encounters:  12/25/15 200 lb (90.7 kg)  07/22/15 204 lb (92.5 kg)  07/23/14 196 lb (88.9 kg)    Physical Exam  Constitutional: He appears well-developed and well-nourished. No distress.  HENT:  Head: Normocephalic and atraumatic.  Right Ear: Hearing, tympanic membrane, external ear and ear canal normal.  Left Ear: Hearing, tympanic membrane, external ear and ear canal normal.  Nose: Nose normal. No mucosal edema or rhinorrhea. Right sinus exhibits no maxillary sinus tenderness and no frontal sinus tenderness. Left sinus exhibits no maxillary sinus tenderness and no frontal sinus tenderness.  Mouth/Throat: Uvula is midline, oropharynx is clear and moist and mucous membranes are normal. No oropharyngeal exudate, posterior oropharyngeal edema, posterior oropharyngeal erythema or tonsillar abscesses.  Eyes: Conjunctivae and EOM are normal. Pupils are equal, round, and reactive to light. No scleral icterus.  Neck: Normal range of motion. Neck supple. No thyromegaly (possible R sided nodule) present.  Cardiovascular: Normal rate, regular rhythm, normal heart sounds and intact distal pulses.   No murmur heard. Pulmonary/Chest: Effort normal and breath sounds normal. No respiratory distress. He has no wheezes. He has no rales.  Musculoskeletal: He exhibits no edema.  Lymphadenopathy:    He has no cervical adenopathy.  Skin: Skin is warm and dry. No rash noted.  Psychiatric: He has a normal mood and affect.  Nursing note and vitals reviewed.     Assessment & Plan:   Problem List Items Addressed This Visit    Neck pain on left side - Primary    Benign exam today.  Anticipate MSK strain.  rec heating pad, gentle stretching.  With concomitant fatigue and possible R thyroid nodule, check thyroid US, labs (TSH, BMP, CBC). Will update with results.       Other Visit Diagnoses    Need for influenza  vaccination       Relevant Orders   Flu Vaccine QUAD 36+ mos PF IM (Fluarix & Fluzone Quad PF) (Completed)   Fatigue, unspecified type       Relevant Orders   TSH   CBC with Differential/Platelet   Basic metabolic panel   Right thyroid nodule       Relevant Orders   US THYROID       Follow up plan: Return if symptoms worsen or fail to improve.  Eustaquio Boyden, MD

## 2015-12-26 LAB — CBC WITH DIFFERENTIAL/PLATELET
Basophils Absolute: 0.1 10*3/uL (ref 0.0–0.1)
Basophils Relative: 0.6 % (ref 0.0–3.0)
Eosinophils Absolute: 0.5 10*3/uL (ref 0.0–0.7)
Eosinophils Relative: 4.9 % (ref 0.0–5.0)
HCT: 47.8 % (ref 39.0–52.0)
Hemoglobin: 16.2 g/dL (ref 13.0–17.0)
Lymphocytes Relative: 24.3 % (ref 12.0–46.0)
Lymphs Abs: 2.4 10*3/uL (ref 0.7–4.0)
MCHC: 33.9 g/dL (ref 30.0–36.0)
MCV: 90 fl (ref 78.0–100.0)
Monocytes Absolute: 1 10*3/uL (ref 0.1–1.0)
Monocytes Relative: 9.8 % (ref 3.0–12.0)
Neutro Abs: 6 10*3/uL (ref 1.4–7.7)
Neutrophils Relative %: 60.4 % (ref 43.0–77.0)
Platelets: 273 10*3/uL (ref 150.0–400.0)
RBC: 5.31 Mil/uL (ref 4.22–5.81)
RDW: 13.5 % (ref 11.5–15.5)
WBC: 10 10*3/uL (ref 4.0–10.5)

## 2015-12-26 LAB — BASIC METABOLIC PANEL
BUN: 15 mg/dL (ref 6–23)
CO2: 25 mEq/L (ref 19–32)
Calcium: 9.8 mg/dL (ref 8.4–10.5)
Chloride: 105 mEq/L (ref 96–112)
Creatinine, Ser: 1 mg/dL (ref 0.40–1.50)
GFR: 88.61 mL/min (ref >60.00)
Glucose, Bld: 75 mg/dL (ref 70–99)
Potassium: 4.4 mEq/L (ref 3.5–5.1)
Sodium: 138 mEq/L (ref 135–145)

## 2015-12-29 ENCOUNTER — Telehealth: Payer: Self-pay | Admitting: Family Medicine

## 2015-12-29 LAB — TSH: TSH: 1.22 u[IU]/mL (ref 0.35–4.50)

## 2015-12-29 NOTE — Telephone Encounter (Signed)
Spoke to pt. He wants to know if someone can call him today with lab results from last week. Please advise.

## 2015-12-29 NOTE — Telephone Encounter (Signed)
Discussed results with patient

## 2016-01-02 ENCOUNTER — Ambulatory Visit: Payer: BLUE CROSS/BLUE SHIELD

## 2016-01-08 ENCOUNTER — Ambulatory Visit: Payer: BLUE CROSS/BLUE SHIELD

## 2016-01-15 ENCOUNTER — Ambulatory Visit
Admission: RE | Admit: 2016-01-15 | Discharge: 2016-01-15 | Disposition: A | Discharge status: Home / Self Care | Payer: BLUE CROSS/BLUE SHIELD | Source: Ambulatory Visit | Attending: Family Medicine | Admitting: Family Medicine

## 2016-01-15 DIAGNOSIS — E041 Nontoxic single thyroid nodule: Secondary | ICD-10-CM | POA: Diagnosis present

## 2016-04-02 ENCOUNTER — Encounter: Payer: Self-pay | Admitting: Internal Medicine

## 2016-04-02 ENCOUNTER — Ambulatory Visit (INDEPENDENT_AMBULATORY_CARE_PROVIDER_SITE_OTHER): Payer: BLUE CROSS/BLUE SHIELD | Admitting: Internal Medicine

## 2016-04-02 VITALS — BP 124/80 | HR 100 | Temp 97.7°F | Wt 199.0 lb

## 2016-04-02 DIAGNOSIS — H00039 Abscess of eyelid unspecified eye, unspecified eyelid: Secondary | ICD-10-CM | POA: Insufficient documentation

## 2016-04-02 DIAGNOSIS — H00036 Abscess of eyelid left eye, unspecified eyelid: Secondary | ICD-10-CM | POA: Diagnosis not present

## 2016-04-02 MED ORDER — CEPHALEXIN 500 MG PO CAPS: 500.0000 mg | ORAL_CAPSULE | Freq: Three times a day (TID) | ORAL | 0 refills | Status: DC

## 2016-04-02 NOTE — Assessment & Plan Note (Signed)
Discussed trying heat or cool compresses Analgesic prn cephalexin

## 2016-04-02 NOTE — Progress Notes (Signed)
   Subjective:    Patient ID: Justin Webb, male    DOB: 01/30/1976, 41 y.o.   MRN: [id number]  HPI Here due to eye swelling and respiratory infection  URI is not a big deal No fever Rhinorrhea, congestion for about 2 days No cough or SOB  His left upper eye lid is very red and really bothering him Started mild 2-3 days ago but worsened Some tenderness and pain Notices some throbbing No eye discharge No redness in eye No trauma or apparent foreign body  Current Outpatient Prescriptions on File Prior to Visit  Medication Sig Dispense Refill  . ibuprofen (ADVIL,MOTRIN) 200 MG tablet Take 200 mg by mouth every 6 (six) hours as needed for pain.    Marland Kitchen. LORazepam (ATIVAN) 0.5 MG tablet TAKE 1 TABLET BY MOUTH TWICE A DAY 30 tablet 0   No current facility-administered medications on file prior to visit.     No Known Allergies  Past Medical History:  Diagnosis Date  . Childhood asthma   . Ex-smoker quit 10/2012  . Kidney stone 12/2011   R  . Pancreatitis   . Testicular torsion 1995   left    Past Surgical History:  Procedure Laterality Date  . exercise treadmill  10/2012   WNL, excellent exercise tolerance  . testicular torsion  1995   left    Family History  Problem Relation Age of Onset  . Healthy Father   . Heart attack Mother     slight; + smoker; Mid 40's  . Diabetes Maternal Grandmother   . Other Paternal Grandfather     Brain tumor    Social History   Social History  . Marital status: Married    Spouse name: N/A  . Number of children: N/A  . Years of education: N/A   Occupational History  . Financial plannernternet Coordinator    Social History Main Topics  . Smoking status: Current Some Day Smoker    Packs/day: 0.25    Years: 10.00    Types: Cigarettes  . Smokeless tobacco: Never Used  . Alcohol use No     Comment: Rare  . Drug use: No  . Sexual activity: Not on file   Other Topics Concern  . 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 No distress.  HENT:  Mouth/Throat: Oropharynx is clear and moist. No oropharyngeal exudate.  TMs normal  Eyes: Conjunctivae are normal.  Mild swelling with deep redness, warmth and slight tenderness of left upper lid No apparent injury or skin break  Neck: No thyromegaly present.  Pulmonary/Chest: Effort normal and breath sounds normal. No respiratory distress. He has no wheezes. He has no rales.  Lymphadenopathy:    He has no cervical adenopathy.          Assessment & Plan:

## 2016-04-02 NOTE — Progress Notes (Signed)
Pre visit review using our clinic review tool, if applicable. No additional management support is needed unless otherwise documented below in the visit note. 

## 2016-04-27 ENCOUNTER — Other Ambulatory Visit: Payer: Self-pay | Admitting: Family Medicine

## 2016-04-27 NOTE — Telephone Encounter (Signed)
plz phone in. 

## 2016-04-27 NOTE — Telephone Encounter (Signed)
Ok to refill? Last filled 10/29/15 #30 0RF 

## 2016-04-28 NOTE — Telephone Encounter (Signed)
Rx called in as directed.   

## 2016-08-11 ENCOUNTER — Other Ambulatory Visit: Payer: Self-pay | Admitting: Family Medicine

## 2016-08-11 NOTE — Telephone Encounter (Signed)
Received refill electronically Last refill 04/27/16 # 30 Last office visit 04/02/16/acute

## 2016-08-12 NOTE — Telephone Encounter (Signed)
plz phone in. 

## 2016-08-12 NOTE — Telephone Encounter (Signed)
Rx called into requested  

## 2016-09-08 ENCOUNTER — Ambulatory Visit (INDEPENDENT_AMBULATORY_CARE_PROVIDER_SITE_OTHER): Payer: BLUE CROSS/BLUE SHIELD | Admitting: Family Medicine

## 2016-09-08 ENCOUNTER — Telehealth: Payer: Self-pay | Admitting: Family Medicine

## 2016-09-08 ENCOUNTER — Encounter: Payer: Self-pay | Admitting: Family Medicine

## 2016-09-08 VITALS — BP 122/68 | HR 74 | Temp 98.1°F | Wt 206.5 lb

## 2016-09-08 DIAGNOSIS — F411 Generalized anxiety disorder: Secondary | ICD-10-CM | POA: Diagnosis not present

## 2016-09-08 MED ORDER — BUSPIRONE HCL 5 MG PO TABS: 5.0000 mg | ORAL_TABLET | Freq: Two times a day (BID) | ORAL | 1 refills | Status: DC

## 2016-09-08 NOTE — Assessment & Plan Note (Addendum)
Progressive worsening stressors at work over months, with acutely worsening stress over last few days. PRN lorazepam 0.5mg  helpful but very sedating. Reviewed importance of healthy stress relieving strategies, encouraged regular exercise regimen. Discussed options. Start buspar 5mg  nightly with slow titration. Pt will call me in 2-3 wks with update, RTC 2 mo f/u visit anxiety.  GAD7 = 18

## 2016-09-08 NOTE — Telephone Encounter (Signed)
Patient Name: Justin Webb DOB: 04/26/1975 Initial Comment Caller states, having light headiness and dizziness, jump out of skin feeling. Hard time sleeping. Nurse Assessment Nurse: Yetta BarreJones, RN, Miranda Date/Time (Eastern Time): 09/08/2016 9:27:22 AM Confirm and document reason for call. If symptomatic, describe symptoms. ---Caller states he "doesn't feel right". He has had more anxiety than normal and needing to take his medication more. He is also having trouble sleeping. He feels lightheaded when he is up and active. Does the patient have any new or worsening symptoms? ---Yes Will a triage be completed? ---Yes Related visit to physician within the last 2 weeks? ---No Does the PT have any chronic conditions? (i.e. diabetes, asthma, etc.) ---Yes List chronic conditions. ---Anxiety Is this a behavioral health or substance abuse call? ---Yes Are you having any thoughts or feelings of harming or killing yourself or someone else? ---No Are you currently experiencing any physical discomfort that you think may be related to the use of alcohol or other drugs? (use substance abuse or alcohol abuse guidelines. These include withdrawal symptoms) ---No Do you worry that you may be hearing or seeing things that others do not? ---No Do you take medications for your condition(s)? ---Yes List medications here. ---Ativan Guidelines Guideline Title Affirmed Question Affirmed Notes Anxiety and Panic Attack Symptoms interfere with work or school Final Disposition User See Physician within 24 Hours Jones, Charity fundraiserN, Loss adjuster, charteredMiranda Comments Appt scheduled with PCP at 12:15p Referrals REFERRED TO PCP OFFICE Disagree/Comply: Comply

## 2016-09-08 NOTE — Progress Notes (Addendum)
BP 122/68   Pulse 74   Temp 98.1 F (36.7 C) (Oral)   Wt 206 lb 8 oz (93.7 kg)   SpO2 98%   BMI 27.24 kg/m    CC: anxiety Subjective:    Patient ID: Justin Webb, male    DOB: [birth year], 41 y.o.   MRN: [id number]  HPI: Justin Webb is a 41 y.o. male presenting on 09/08/2016 for Anxiety   Several day history of worsening anxiety associated with mild lightheadedness, mild chest discomfort and dyspnea associated with anxiety, not exertion. Some palpitations endorsed as well as some insomnia last night. Anxiety worse over last 2 months.   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   Does de-stress at night time by watching TV.  Things going well at home.   Reviewed recent labs from 11/2015 as well as normal ETT from 2014.   Relevant past medical, surgical, family and social history reviewed and updated as indicated. Interim medical history since our last visit reviewed. Allergies and medications reviewed and updated. Outpatient Medications Prior to Visit  Medication Sig Dispense Refill  . ibuprofen (ADVIL,MOTRIN) 200 MG tablet Take 200 mg by mouth every 6 (six) hours as needed for pain.    Marland Kitchen LORazepam (ATIVAN) 0.5 MG tablet TAKE 1 TABLET BY MOUTH TWICE A DAY 30 tablet 0  . cephALEXin (KEFLEX) 500 MG capsule Take 1 capsule (500 mg total) by mouth 3 (three) times daily. 21 capsule 0   No facility-administered medications prior to visit.      Per HPI unless specifically indicated in ROS section below Review of Systems     Objective:    BP 122/68   Pulse 74   Temp 98.1 F (36.7 C) (Oral)   Wt 206 lb 8 oz (93.7 kg)   SpO2 98%   BMI 27.24 kg/m   Wt Readings from Last 3 Encounters:  09/08/16 206 lb 8 oz (93.7 kg)  04/02/16 199 lb (90.3 kg)  12/25/15 200 lb (90.7 kg)    Physical Exam  Constitutional: He appears well-developed and well-nourished. No distress.  HENT:  Mouth/Throat: Oropharynx is clear and  moist. No oropharyngeal exudate.  Neck: No thyromegaly present.  Cardiovascular: Normal rate, regular rhythm, normal heart sounds and intact distal pulses.   No murmur heard. Pulmonary/Chest: Effort normal and breath sounds normal. No respiratory distress. He has no wheezes. He has no rales.  Musculoskeletal: He exhibits no edema.  Skin: Skin is warm and dry. No rash noted.  Psychiatric: He has a normal mood and affect.  Nursing note and vitals reviewed.  Results for orders placed or performed in visit on 12/25/15  TSH  Result Value Ref Range   TSH 1.22 0.35 - 4.50 uIU/mL  CBC with Differential/Platelet  Result Value Ref Range   WBC 10.0 4.0 - 10.5 K/uL   RBC 5.31 4.22 - 5.81 Mil/uL   Hemoglobin 16.2 13.0 - 17.0 g/dL   HCT 78.2 95.6 - 21.3 %   MCV 90.0 78.0 - 100.0 fl   MCHC 33.9 30.0 - 36.0 g/dL   RDW 08.6 57.8 - 46.9 %   Platelets 273.0 150.0 - 400.0 K/uL   Neutrophils Relative % 60.4 43.0 - 77.0 %   Lymphocytes Relative 24.3 12.0 - 46.0 %   Monocytes Relative 9.8 3.0 - 12.0 %   Eosinophils Relative 4.9 0.0 - 5.0 %   Basophils Relative 0.6 0.0 - 3.0 %  Neutro Abs 6.0 1.4 - 7.7 K/uL   Lymphs Abs 2.4 0.7 - 4.0 K/uL   Monocytes Absolute 1.0 0.1 - 1.0 K/uL   Eosinophils Absolute 0.5 0.0 - 0.7 K/uL   Basophils Absolute 0.1 0.0 - 0.1 K/uL  Basic metabolic panel  Result Value Ref Range   Sodium 138 135 - 145 mEq/L   Potassium 4.4 3.5 - 5.1 mEq/L   Chloride 105 96 - 112 mEq/L   CO2 25 19 - 32 mEq/L   Glucose, Bld 75 70 - 99 mg/dL   BUN 15 6 - 23 mg/dL   Creatinine, Ser 1.611.00 0.40 - 1.50 mg/dL   Calcium 9.8 8.4 - 09.610.5 mg/dL   GFR 04.5488.61 >09.81>60.00 mL/min      Assessment & Plan:   Problem List Items Addressed This Visit    Anxiety state - Primary    Progressive worsening stressors at work over months, with acutely worsening stress over last few days. PRN lorazepam 0.5mg  helpful but very sedating. Reviewed importance of healthy stress relieving strategies, encouraged regular  exercise regimen. Discussed options. Start buspar 5mg  nightly with slow titration. Pt will call me in 2-3 wks with update, RTC 2 mo f/u visit anxiety.  GAD7 = 18      Relevant Medications   busPIRone (BUSPAR) 5 MG tablet       Follow up plan: Return in about 2 months (around 11/09/2016), or if symptoms worsen or fail to improve, for follow up visit.  Eustaquio BoydenJavier Aleah Ahlgrim, MD

## 2016-09-08 NOTE — Patient Instructions (Addendum)
I do think anxiety has deteriorated.  May continue lorazepam as needed. Add on buspar daily anxiety medicine. Start nightly for 4 days then increase to twice daily. Update me with effect in 2-3 weeks.

## 2016-09-08 NOTE — Telephone Encounter (Signed)
Pt has appt with Dr Sharen HonesGutierrez 09/08/16 at 12:15.

## 2016-11-09 ENCOUNTER — Ambulatory Visit: Payer: BLUE CROSS/BLUE SHIELD | Admitting: Family Medicine

## 2016-11-22 ENCOUNTER — Other Ambulatory Visit: Payer: Self-pay | Admitting: Family Medicine

## 2016-11-22 NOTE — Telephone Encounter (Signed)
Last filled 08/12/16, #30 Last OV:  09/08/16 Next OV:  none

## 2016-11-23 NOTE — Telephone Encounter (Signed)
Pt said that buspar has worked wonders and pt has appt on 11/25/16 to discuss how buspar is working but pt is almost out of lorazepam and request refill to [facility] [place]. Dr Reece Agar is out of office today. Please advise.

## 2016-11-23 NOTE — Telephone Encounter (Signed)
Left refill on voice mail at pharmacy  

## 2016-11-23 NOTE — Telephone Encounter (Signed)
plz phone in. 

## 2016-11-25 ENCOUNTER — Ambulatory Visit (INDEPENDENT_AMBULATORY_CARE_PROVIDER_SITE_OTHER): Payer: BLUE CROSS/BLUE SHIELD | Admitting: Family Medicine

## 2016-11-25 ENCOUNTER — Encounter: Payer: Self-pay | Admitting: Family Medicine

## 2016-11-25 ENCOUNTER — Encounter (INDEPENDENT_AMBULATORY_CARE_PROVIDER_SITE_OTHER): Payer: Self-pay

## 2016-11-25 VITALS — BP 122/84 | HR 97 | Temp 98.2°F | Wt 206.0 lb

## 2016-11-25 DIAGNOSIS — F411 Generalized anxiety disorder: Secondary | ICD-10-CM | POA: Diagnosis not present

## 2016-11-25 MED ORDER — BUSPIRONE HCL 5 MG PO TABS: 5.0000 mg | ORAL_TABLET | Freq: Two times a day (BID) | ORAL | 3 refills | Status: DC

## 2016-11-25 NOTE — Patient Instructions (Addendum)
Buspar 3 month supply sent in to [facility]. Continue current dose.  Ok to continue lorazepam to use as needed.  Return as needed or in 6 months for physical.

## 2016-11-25 NOTE — Assessment & Plan Note (Signed)
Reviewed ongoing work stressors. Reviewed stress relieving strategies. Doing well on current buspar, will refill x 3 months. RTC 6 mo CPE. He is considering looking for a new job.

## 2016-11-25 NOTE — Progress Notes (Signed)
   BP 122/84 (BP Location: Left Arm, Patient Position: Sitting, Cuff Size: Normal)   Pulse 97   Temp 98.2 F (36.8 C) (Oral)   Wt 206 lb (93.4 kg)   SpO2 97%   BMI 27.18 kg/m    CC: 2 mo f/u visit Subjective:    Patient ID: Justin Webb, male    DOB: 02-Feb-1976, 41 y.o.   MRN: [id number]  HPI: Justin Webb is a 41 y.o. male presenting on 11/25/2016 for 2 mo follow-up   See prior note for details. Seen here 2 months ago with mild chest discomfort, lightheadedness, dyspnea attributed to anxiety. This in setting of increased work stress. Buspar  bid was started and lorazepam 0.5mg  PRN was continued (helpful but sedating). Takes benzo a few times a week. Finds buspar has significantly helped him manage his anxiety and desires to continue at this dose.   Reviewed stress relieving strategies - watching TV, yardwork  Relevant past medical, surgical, family and social history reviewed and updated as indicated. Interim medical history since our last visit reviewed. Allergies and medications reviewed and updated. Outpatient Medications Prior to Visit  Medication Sig Dispense Refill  . ibuprofen (ADVIL,MOTRIN) 200 MG tablet Take 200 mg by mouth every 6 (six) hours as needed for pain.    Marland Kitchen LORazepam (ATIVAN) 0.5 MG tablet TAKE 1 TABLET BY MOUTH TWICE DAILY 30 tablet 0  . busPIRone (BUSPAR) 5 MG tablet Take 1 tablet (5 mg total) by mouth 2 (two) times daily. First 4 days take once nightly 60 tablet 1   No facility-administered medications prior to visit.      Per HPI unless specifically indicated in ROS section below Review of Systems     Objective:    BP 122/84 (BP Location: Left Arm, Patient Position: Sitting, Cuff Size: Normal)   Pulse 97   Temp 98.2 F (36.8 C) (Oral)   Wt 206 lb (93.4 kg)   SpO2 97%   BMI 27.18 kg/m   Wt Readings from Last 3 Encounters:  11/25/16 206 lb (93.4 kg)  09/08/16 206 lb 8 oz (93.7 kg)  04/02/16 199 lb (90.3 kg)    Physical Exam    Constitutional: He appears well-developed and well-nourished. No distress.  Psychiatric: He has a normal mood and affect.  Nursing note and vitals reviewed.     Assessment & Plan:   Problem List Items Addressed This Visit    Anxiety state - Primary    Reviewed ongoing work stressors. Reviewed stress relieving strategies. Doing well on current buspar, will refill x 3 months. RTC 6 mo CPE. He is considering looking for a new job.       Relevant Medications   busPIRone (BUSPAR) 5 MG tablet       Follow up plan: Return in about 6 months (around 05/25/2017) for annual exam, prior fasting for blood work.  Eustaquio Boyden, MD

## 2017-02-08 ENCOUNTER — Other Ambulatory Visit: Payer: Self-pay | Admitting: Family Medicine

## 2017-02-09 NOTE — Telephone Encounter (Signed)
Last filled:  11/23/16, #30 Last OV:  11/25/16 Next OV:  05/25/16

## 2017-02-09 NOTE — Telephone Encounter (Signed)
Sent electronically 

## 2017-04-12 ENCOUNTER — Other Ambulatory Visit: Payer: Self-pay | Admitting: Family Medicine

## 2017-04-12 NOTE — Telephone Encounter (Signed)
Last filled:  02/09/17, #30 Last OV:  11/25/16 Next OV:  05/25/16

## 2017-04-13 NOTE — Telephone Encounter (Signed)
Eprescribed.

## 2017-05-25 ENCOUNTER — Ambulatory Visit (INDEPENDENT_AMBULATORY_CARE_PROVIDER_SITE_OTHER): Payer: BLUE CROSS/BLUE SHIELD | Admitting: Family Medicine

## 2017-05-25 ENCOUNTER — Encounter: Payer: Self-pay | Admitting: Family Medicine

## 2017-05-25 VITALS — BP 118/68 | HR 75 | Temp 98.2°F | Ht 71.75 in | Wt 208.2 lb

## 2017-05-25 DIAGNOSIS — Z0000 Encounter for general adult medical examination without abnormal findings: Secondary | ICD-10-CM

## 2017-05-25 DIAGNOSIS — Z13220 Encounter for screening for lipoid disorders: Secondary | ICD-10-CM | POA: Diagnosis not present

## 2017-05-25 DIAGNOSIS — Z1211 Encounter for screening for malignant neoplasm of colon: Secondary | ICD-10-CM | POA: Insufficient documentation

## 2017-05-25 DIAGNOSIS — F411 Generalized anxiety disorder: Secondary | ICD-10-CM | POA: Diagnosis not present

## 2017-05-25 DIAGNOSIS — F17200 Nicotine dependence, unspecified, uncomplicated: Secondary | ICD-10-CM

## 2017-05-25 DIAGNOSIS — Z131 Encounter for screening for diabetes mellitus: Secondary | ICD-10-CM | POA: Diagnosis not present

## 2017-05-25 MED ORDER — LORAZEPAM 0.5 MG PO TABS: 0.5000 mg | ORAL_TABLET | Freq: Two times a day (BID) | ORAL | 3 refills | Status: DC | PRN

## 2017-05-25 NOTE — Progress Notes (Signed)
BP 118/68 (BP Location: Left Arm, Patient Position: Sitting, Cuff Size: Normal)   Pulse 75   Temp 98.2 F (36.8 C) (Oral)   Ht 5' 11.75" (1.822 m)   Wt 208 lb 4 oz (94.5 kg)   SpO2 97%   BMI 28.44 kg/m    CC: CPE Subjective:    Patient ID: Justin Webb, male    DOB: 09/26/1975, 42 y.o.   MRN: [id number]  HPI: Justin SalenWilliam Kilbourne Webb is a 42 y.o. male presenting on 05/25/2017 for Annual Exam (Requests 90 day rx for lorzepam. )   Fasting for 4 hrs.  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 Persistent trouble sleeping - takes lorazepam at night PRN for this.   Preventative: Flu shot yearly Td 2011 Seat belt use discussed Sunscreen use discussed. No changing moles on skin.  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 Interim medical history since our last visit reviewed. Allergies and medications reviewed and updated. Outpatient Medications Prior to Visit  Medication Sig Dispense Refill  . busPIRone (BUSPAR) 5 MG tablet Take 1 tablet (5 mg total) by mouth 2 (two) times daily. First 4 days take once nightly 180 tablet 3  . ibuprofen (ADVIL,MOTRIN) 200 MG tablet Take 200 mg by mouth every 6 (six) hours as needed for pain.    Marland Kitchen. LORazepam (ATIVAN) 0.5 MG tablet TAKE 1 TABLET BY MOUTH TWICE A DAY AS NEEDED 30 tablet 0   No facility-administered medications prior to visit.      Per HPI unless specifically indicated in ROS section below Review of Systems  Constitutional: Negative for activity change, appetite change, chills, fatigue, fever and  unexpected weight change.  HENT: Negative for hearing loss.   Eyes: Negative for visual disturbance.  Respiratory: Negative for cough, chest tightness, shortness of breath and wheezing.   Cardiovascular: Negative for chest pain, palpitations and leg swelling.  Gastrointestinal: Negative for abdominal distention, abdominal pain, blood in stool, constipation, diarrhea, nausea and vomiting.  Genitourinary: Negative for difficulty urinating and hematuria.  Musculoskeletal: Negative for arthralgias, myalgias and neck pain.  Skin: Negative for rash.  Neurological: Negative for dizziness, seizures, syncope and headaches.  Hematological: Negative for adenopathy. Does not bruise/bleed easily.  Psychiatric/Behavioral: Negative for dysphoric mood. The patient is nervous/anxious (see HPI).        Objective:    BP 118/68 (BP Location: Left Arm, Patient Position: Sitting, Cuff Size: Normal)   Pulse 75   Temp 98.2 F (36.8 C) (Oral)   Ht 5' 11.75" (1.822 m)   Wt 208 lb 4 oz (94.5 kg)   SpO2 97%   BMI 28.44 kg/m   Wt Readings from Last 3 Encounters:  05/25/17 208 lb 4 oz (94.5 kg)  11/25/16 206 lb (93.4 kg)  09/08/16 206 lb 8 oz (93.7 kg)    Physical Exam  Constitutional: He is oriented to person, place, and time. He appears well-developed and well-nourished. No distress.  HENT:  Head: Normocephalic and atraumatic.  Right Ear: Hearing, tympanic membrane, external ear and ear canal normal.  Left Ear: Hearing, tympanic membrane, external ear and ear  canal normal.  Nose: Nose normal.  Mouth/Throat: Uvula is midline, oropharynx is clear and moist and mucous membranes are normal. No oropharyngeal exudate, posterior oropharyngeal edema or posterior oropharyngeal erythema.  Eyes: Pupils are equal, round, and reactive to light. Conjunctivae and EOM are normal. No scleral icterus.  Neck: Normal range of motion. Neck supple. No thyromegaly present.  Cardiovascular: Normal rate, regular rhythm, normal  heart sounds and intact distal pulses.  No murmur heard. Pulses:      Radial pulses are 2+ on the right side, and 2+ on the left side.  Pulmonary/Chest: Effort normal and breath sounds normal. No respiratory distress. He has no wheezes. He has no rales.  Abdominal: Soft. Bowel sounds are normal. He exhibits no distension and no mass. There is no tenderness. There is no rebound and no guarding.  Musculoskeletal: Normal range of motion. He exhibits no edema.  Lymphadenopathy:    He has no cervical adenopathy.  Neurological: He is alert and oriented to person, place, and time.  CN grossly intact, station and gait intact  Skin: Skin is warm and dry. No rash noted.  Psychiatric: He has a normal mood and affect. His behavior is normal. Judgment and thought content normal.  Nursing note and vitals reviewed.  Results for orders placed or performed in visit on 12/25/15  TSH  Result Value Ref Range   TSH 1.22 0.35 - 4.50 uIU/mL  CBC with Differential/Platelet  Result Value Ref Range   WBC 10.0 4.0 - 10.5 K/uL   RBC 5.31 4.22 - 5.81 Mil/uL   Hemoglobin 16.2 13.0 - 17.0 g/dL   HCT 16.1 09.6 - 04.5 %   MCV 90.0 78.0 - 100.0 fl   MCHC 33.9 30.0 - 36.0 g/dL   RDW 40.9 81.1 - 91.4 %   Platelets 273.0 150.0 - 400.0 K/uL   Neutrophils Relative % 60.4 43.0 - 77.0 %   Lymphocytes Relative 24.3 12.0 - 46.0 %   Monocytes Relative 9.8 3.0 - 12.0 %   Eosinophils Relative 4.9 0.0 - 5.0 %   Basophils Relative 0.6 0.0 - 3.0 %   Neutro Abs 6.0 1.4 - 7.7 K/uL   Lymphs Abs 2.4 0.7 - 4.0 K/uL   Monocytes Absolute 1.0 0.1 - 1.0 K/uL   Eosinophils Absolute 0.5 0.0 - 0.7 K/uL   Basophils Absolute 0.1 0.0 - 0.1 K/uL  Basic metabolic panel  Result Value Ref Range   Sodium 138 135 - 145 mEq/L   Potassium 4.4 3.5 - 5.1 mEq/L   Chloride 105 96 - 112 mEq/L   CO2 25 19 - 32 mEq/L   Glucose, Bld 75 70 - 99 mg/dL   BUN 15 6 - 23 mg/dL   Creatinine, Ser 7.82 0.40 - 1.50 mg/dL   Calcium 9.8 8.4 - 95.6 mg/dL    GFR 21.30 >86.57 mL/min      Assessment & Plan:   Problem List Items Addressed This Visit    Anxiety state    Improving stressors at work have improved anxiety state.  Discussed medication use.  Has not been regular with buspar. Encouraged working on stress relieving strategies and trying to decrease lorazepam use.       Relevant Medications   LORazepam (ATIVAN) 0.5 MG tablet   Health maintenance examination - Primary    Preventative protocols reviewed and updated unless pt declined. Discussed healthy diet and lifestyle.       Smoker    Continue to encourage smoking cessation. Contemplative.  Other Visit Diagnoses    Lipid screening       Relevant Orders   Lipid panel   Diabetes mellitus screening       Relevant Orders   Basic metabolic panel       Meds ordered this encounter  Medications  . LORazepam (ATIVAN) 0.5 MG tablet    Sig: Take 1 tablet (0.5 mg total) by mouth 2 (two) times daily as needed.    Dispense:  30 tablet    Refill:  3    Not to exceed 5 additional fills before 08/08/2017.   Orders Placed This Encounter  Procedures  . Lipid panel  . Basic metabolic panel    Follow up plan: Return in about 1 year (around 05/26/2018) for annual exam, prior fasting for blood work.  Eustaquio Boyden, MD

## 2017-05-25 NOTE — Assessment & Plan Note (Signed)
Preventative protocols reviewed and updated unless pt declined. Discussed healthy diet and lifestyle.  

## 2017-05-25 NOTE — Patient Instructions (Signed)
Labs today.  Use lorazepam sparingly as needed for sleep/anxiety.  Trial daily buspar and see if any benefit for sleep.  Work on incorporating regular exercise into routine. Have a great birthday!  Return as needed or in 1 year for next physical.   Health Maintenance, Male A healthy lifestyle and preventive care is important for your health and wellness. Ask your health care provider about what schedule of regular examinations is right for you. What should I know about weight and diet? Eat a Healthy Diet  Eat plenty of vegetables, fruits, whole grains, low-fat dairy products, and lean protein.  Do not eat a lot of foods high in solid fats, added sugars, or salt.  Maintain a Healthy Weight Regular exercise can help you achieve or maintain a healthy weight. You should:  Do at least 150 minutes of exercise each week. The exercise should increase your heart rate and make you sweat (moderate-intensity exercise).  Do strength-training exercises at least twice a week.  Watch Your Levels of Cholesterol and Blood Lipids  Have your blood tested for lipids and cholesterol every 5 years starting at 40 years of age. If you are at high risk for heart disease, you should start having your blood tested when you are 40 years old. You may need to have your cholesterol levels checked more often if: ? Your lipid or cholesterol levels are high. ? You are older than 40 years of age. ? You are at high risk for heart disease.  What should I know about cancer screening? Many types of cancers can be detected early and may often be prevented. Lung Cancer  You should be screened every year for lung cancer if: ? You are a current smoker who has smoked for at least 30 years. ? You are a former smoker who has quit within the past 15 years.  Talk to your health care provider about your screening options, when you should start screening, and how often you should be screened.  Colorectal Cancer  Routine  colorectal cancer screening usually begins at 40 years of age and should be repeated every 5-10 years until you are 40 years old. You may need to be screened more often if early forms of precancerous polyps or small growths are found. Your health care provider may recommend screening at an earlier age if you have risk factors for colon cancer.  Your health care provider may recommend using home test kits to check for hidden blood in the stool.  A small camera at the end of a tube can be used to examine your colon (sigmoidoscopy or colonoscopy). This checks for the earliest forms of colorectal cancer.  Prostate and Testicular Cancer  Depending on your age and overall health, your health care provider may do certain tests to screen for prostate and testicular cancer.  Talk to your health care provider about any symptoms or concerns you have about testicular or prostate cancer.  Skin Cancer  Check your skin from head to toe regularly.  Tell your health care provider about any new moles or changes in moles, especially if: ? There is a change in a mole's size, shape, or color. ? You have a mole that is larger than a pencil eraser.  Always use sunscreen. Apply sunscreen liberally and repeat throughout the day.  Protect yourself by wearing long sleeves, pants, a wide-brimmed hat, and sunglasses when outside.  What should I know about heart disease, diabetes, and high blood pressure?  If you are 18-39  years of age, have your blood pressure checked every 3-5 years. If you are 54 years of age or older, have your blood pressure checked every year. You should have your blood pressure measured twice-once when you are at a hospital or clinic, and once when you are not at a hospital or clinic. Record the average of the two measurements. To check your blood pressure when you are not at a hospital or clinic, you can use: ? An automated blood pressure machine at a pharmacy. ? A home blood pressure  monitor.  Talk to your health care provider about your target blood pressure.  If you are between 58-28 years old, ask your health care provider if you should take aspirin to prevent heart disease.  Have regular diabetes screenings by checking your fasting blood sugar level. ? If you are at a normal weight and have a low risk for diabetes, have this test once every three years after the age of 34. ? If you are overweight and have a high risk for diabetes, consider being tested at a younger age or more often.  A one-time screening for abdominal aortic aneurysm (AAA) by ultrasound is recommended for men aged 39-75 years who are current or former smokers. What should I know about preventing infection? Hepatitis B If you have a higher risk for hepatitis B, you should be screened for this virus. Talk with your health care provider to find out if you are at risk for hepatitis B infection. Hepatitis C Blood testing is recommended for:  Everyone born from [birth year] through 1963.  Anyone with known risk factors for hepatitis C.  Sexually Transmitted Diseases (STDs)  You should be screened each year for STDs including gonorrhea and chlamydia if: ? You are sexually active and are younger than 40 years of age. ? You are older than 40 years of age and your health care provider tells you that you are at risk for this type of infection. ? Your sexual activity has changed since you were last screened and you are at an increased risk for chlamydia or gonorrhea. Ask your health care provider if you are at risk.  Talk with your health care provider about whether you are at high risk of being infected with HIV. Your health care provider may recommend a prescription medicine to help prevent HIV infection.  What else can I do?  Schedule regular health, dental, and eye exams.  Stay current with your vaccines (immunizations).  Do not use any tobacco products, such as cigarettes, chewing tobacco, and  e-cigarettes. If you need help quitting, ask your health care provider.  Limit alcohol intake to no more than 2 drinks per day. One drink equals 12 ounces of beer, 5 ounces of wine, or 1 ounces of hard liquor.  Do not use street drugs.  Do not share needles.  Ask your health care provider for help if you need support or information about quitting drugs.  Tell your health care provider if you often feel depressed.  Tell your health care provider if you have ever been abused or do not feel safe at home. This information is not intended to replace advice given to you by your health care provider. Make sure you discuss any questions you have with your health care provider. Document Released: 08/14/2007 Document Revised: 10/15/2015 Document Reviewed: 11/19/2014 Elsevier Interactive Patient Education  [employer].

## 2017-05-25 NOTE — Assessment & Plan Note (Addendum)
Continue to encourage smoking cessation. Contemplative.  °

## 2017-05-25 NOTE — Assessment & Plan Note (Addendum)
Improving stressors at work have improved anxiety state.  Discussed medication use.  Has not been regular with buspar. Encouraged working on stress relieving strategies and trying to decrease lorazepam use.

## 2017-05-26 LAB — BASIC METABOLIC PANEL
BUN: 16 mg/dL (ref 6–23)
CO2: 25 mEq/L (ref 19–32)
Calcium: 9.2 mg/dL (ref 8.4–10.5)
Chloride: 104 mEq/L (ref 96–112)
Creatinine, Ser: 1 mg/dL (ref 0.40–1.50)
GFR: 87.97 mL/min (ref >60.00)
Glucose, Bld: 89 mg/dL (ref 70–99)
Potassium: 4.2 mEq/L (ref 3.5–5.1)
Sodium: 138 mEq/L (ref 135–145)

## 2017-05-26 LAB — LIPID PANEL
Cholesterol: 151 mg/dL (ref 0–200)
HDL: 40.6 mg/dL (ref >39.00)
LDL Cholesterol: 93 mg/dL (ref 0–99)
NonHDL: 110.12
Total CHOL/HDL Ratio: 4
Triglycerides: 88 mg/dL (ref 0.0–149.0)
VLDL: 17.6 mg/dL (ref 0.0–40.0)

## 2017-09-30 ENCOUNTER — Emergency Department
Admission: EM | Admit: 2017-09-30 | Discharge: 2017-09-30 | Disposition: A | Discharge status: Home / Self Care | Payer: BLUE CROSS/BLUE SHIELD | Attending: Emergency Medicine | Admitting: Emergency Medicine

## 2017-09-30 ENCOUNTER — Emergency Department: Payer: BLUE CROSS/BLUE SHIELD

## 2017-09-30 DIAGNOSIS — Z79899 Other long term (current) drug therapy: Secondary | ICD-10-CM | POA: Insufficient documentation

## 2017-09-30 DIAGNOSIS — R0789 Other chest pain: Secondary | ICD-10-CM

## 2017-09-30 DIAGNOSIS — F17210 Nicotine dependence, cigarettes, uncomplicated: Secondary | ICD-10-CM | POA: Diagnosis not present

## 2017-09-30 DIAGNOSIS — R079 Chest pain, unspecified: Secondary | ICD-10-CM | POA: Diagnosis present

## 2017-09-30 HISTORY — DX: Anxiety disorder, unspecified: F41.9

## 2017-09-30 LAB — CBC
HCT: 45.4 % (ref 40.0–52.0)
Hemoglobin: 15.9 g/dL (ref 13.0–18.0)
MCH: 31.4 pg (ref 26.0–34.0)
MCHC: 35 g/dL (ref 32.0–36.0)
MCV: 89.7 fL (ref 80.0–100.0)
Platelets: 245 10*3/uL (ref 150–440)
RBC: 5.07 MIL/uL (ref 4.40–5.90)
RDW: 13.7 % (ref 11.5–14.5)
WBC: 11.5 10*3/uL — ABNORMAL HIGH (ref 3.8–10.6)

## 2017-09-30 LAB — BASIC METABOLIC PANEL
Anion gap: 7 (ref 5–15)
BUN: 14 mg/dL (ref 6–20)
CO2: 26 mmol/L (ref 22–32)
Calcium: 9.1 mg/dL (ref 8.9–10.3)
Chloride: 105 mmol/L (ref 98–111)
Creatinine, Ser: 1.14 mg/dL (ref 0.61–1.24)
GFR calc Af Amer: >60 mL/min (ref >60)
GFR calc non Af Amer: >60 mL/min (ref >60)
Glucose, Bld: 93 mg/dL (ref 70–99)
Potassium: 3.8 mmol/L (ref 3.5–5.1)
Sodium: 138 mmol/L (ref 135–145)

## 2017-09-30 LAB — TROPONIN I: Troponin I: <0.03 ng/mL (ref <0.03)

## 2017-09-30 MED ORDER — SUCRALFATE 1 G PO TABS
1.0000 g | ORAL_TABLET | Freq: Once | ORAL | Status: AC
  Administered 2017-09-30: 1 g via ORAL
  Filled 2017-09-30: qty 1

## 2017-09-30 MED ORDER — FAMOTIDINE 20 MG PO TABS: 20.0000 mg | ORAL_TABLET | Freq: Two times a day (BID) | ORAL | 0 refills | Status: DC

## 2017-09-30 MED ORDER — GI COCKTAIL ~~LOC~~
30.0000 mL | Freq: Once | ORAL | Status: AC
  Administered 2017-09-30: 30 mL via ORAL
  Filled 2017-09-30: qty 30

## 2017-09-30 NOTE — ED Provider Notes (Signed)
[facility] Emergency Department Provider Note  ____________________________________________   First MD Initiated Contact with Patient 09/30/17 0250     (approximate)  I have reviewed the triage vital signs and the nursing notes.   HISTORY  Chief Complaint Chest Pain   HPI Justin Webb is a 42 y.o. male who self presents to the emergency department with substernal constant pressure-like moderate severity nonradiating chest pain that began about a day ago.  No shortness of breath.  No abdominal pain nausea or vomiting.  Nonexertional.  No leg swelling.  The pain is not ripping or tearing it does not go straight to his back.  It was not sudden onset.  No history of DVT or pulmonary embolism.  No hemoptysis.  No recent surgery travel or immobilization.  No history of the same.  Nothing seems to make the symptoms better or worse.  It is not positional.    Past Medical History:  Diagnosis Date  . Anxiety   . Childhood asthma   . Ex-smoker quit 10/2012  . Kidney stone 12/2011   R  . Pancreatitis   . Testicular torsion 1995   left    Patient Active Problem List   Diagnosis Date Noted  . Health maintenance examination 05/25/2017  . Neck pain on left side 12/25/2015  . Anxiety state 10/12/2012  . Smoker 10/12/2012  . Groin pain 08/10/2011    Past Surgical History:  Procedure Laterality Date  . exercise treadmill  10/2012   WNL, excellent exercise tolerance  . testicular torsion  1995   left    Prior to Admission medications   Medication Sig Start Date End Date Taking? Authorizing Provider  busPIRone (BUSPAR) 5 MG tablet Take 1 tablet (5 mg total) by mouth 2 (two) times daily. First 4 days take once nightly 11/25/16   Eustaquio Boyden, MD  famotidine (PEPCID) 20 MG tablet Take 1 tablet (20 mg total) by mouth 2 (two) times daily. 09/30/17 09/30/18  Merrily Brittle, MD  ibuprofen (ADVIL,MOTRIN) 200 MG tablet Take 200 mg by mouth every 6 (six) hours  as needed for pain.    [provider]  LORazepam (ATIVAN) 0.5 MG tablet Take 1 tablet (0.5 mg total) by mouth 2 (two) times daily as needed. 05/25/17   Eustaquio Boyden, MD    Allergies Patient has no known allergies.  Family History  Problem Relation Age of Onset  . Healthy Father   . Heart attack Mother        slight; + smoker; Mid 40's  . Diabetes Maternal Grandmother   . Other Paternal Grandfather        Brain tumor    Social History Social History   Tobacco Use  . Smoking status: Current Some Day Smoker    Packs/day: 0.25    Years: 10.00    Pack years: 2.50    Types: Cigarettes  . Smokeless tobacco: Never Used  Substance Use Topics  . Alcohol use: No    Alcohol/week: 0.0 oz    Comment: Rare  . Drug use: No    Review of Systems Constitutional: No fever/chills Eyes: No visual changes. ENT: No sore throat. Cardiovascular: Positive for chest pain. Respiratory: Denies shortness of breath. Gastrointestinal: No abdominal pain.  No nausea, no vomiting.  No diarrhea.  No constipation. Genitourinary: Negative for dysuria. Musculoskeletal: Negative for back pain. Skin: Negative for rash. Neurological: Negative for headaches, focal weakness or numbness.   ____________________________________________   PHYSICAL EXAM:  VITAL SIGNS:  ED Triage Vitals  Enc Vitals Group     BP 09/30/17 0055 115/79     Pulse Rate 09/30/17 0055 80     Resp 09/30/17 0055 18     Temp 09/30/17 0055 97.9 F (36.6 C)     Temp Source 09/30/17 0055 Oral     SpO2 09/30/17 0055 100 %     Weight 09/30/17 0056 205 lb (93 kg)     Height 09/30/17 0056 6\' 1"  (1.854 m)     Head Circumference --      Peak Flow --      Pain Score 09/30/17 0056 7     Pain Loc --      Pain Edu? --      Excl. in GC? --     Constitutional: Alert and oriented x4 appears somewhat uncomfortable holding his lower chest Eyes: PERRL EOMI. Head: Atraumatic. Nose: No congestion/rhinnorhea. Mouth/Throat: No  trismus Neck: No stridor.   Cardiovascular: Normal rate, regular rhythm. Grossly normal heart sounds.  Good peripheral circulation. Respiratory: Normal respiratory effort.  No retractions. Lungs CTAB and moving good air Gastrointestinal: Soft nontender Musculoskeletal: No lower extremity edema   Neurologic:  Normal speech and language. No gross focal neurologic deficits are appreciated. Skin:  Skin is warm, dry and intact. No rash noted. Psychiatric: Mood and affect are normal. Speech and behavior are normal.    ____________________________________________   DIFFERENTIAL includes but not limited to  Pancreatitis, acute coronary syndrome, gastric reflux, pulmonary embolism ____________________________________________   LABS (all labs ordered are listed, but only abnormal results are displayed)  Labs Reviewed  CBC - Abnormal; Notable for the following components:      Result Value   WBC 11.5 (*)    All other components within normal limits  BASIC METABOLIC PANEL  TROPONIN I    Work reviewed by me with no acute disease __________________________________________  EKG  ED ECG REPORT I, Merrily BrittleNeil Taresa [place], the attending physician, personally viewed and interpreted this ECG.  Date: 10/02/2017 EKG Time:  Rate: 73 Rhythm: normal sinus rhythm QRS Axis: normal Intervals: normal ST/T Wave abnormalities: normal Narrative Interpretation: no evidence of acute ischemia  ____________________________________________  RADIOLOGY  Chest x-ray reviewed by me with no infiltrate but concerning for possible bronchitis ____________________________________________   PROCEDURES  Procedure(s) performed: no  Procedures  Critical Care performed: no  ____________________________________________   INITIAL IMPRESSION / ASSESSMENT AND PLAN / ED COURSE  Pertinent labs & imaging results that were available during my care of the patient were reviewed by me and considered in my medical  decision making (see chart for details).   As part of my medical decision making, I reviewed the following data within the electronic MEDICAL RECORD NUMBER History obtained from family if available, nursing notes, old chart and ekg, as well as notes from prior ED visits.  The patient arrives with atypical chest pain.  Given GI cocktail and sacral fate with significant improvement in his symptoms.  Troponin is negative and EKG is nonischemic.  I appreciate that the chest x-ray is concerning for bronchitis but he has no wheeze and no cough.  I will treat him symptomatically with famotidine for a month and refer him back to primary care to discuss possible GI referral.  He is discharged home in improved condition.      ____________________________________________   FINAL CLINICAL IMPRESSION(S) / ED DIAGNOSES  Final diagnoses:  Atypical chest pain      NEW MEDICATIONS STARTED DURING THIS VISIT:  Discharge  Medication List as of 09/30/2017  5:21 AM    START taking these medications   Details  famotidine (PEPCID) 20 MG tablet Take 1 tablet (20 mg total) by mouth 2 (two) times daily., Starting Fri 09/30/2017, Until Sat 09/30/2018, Print         Note:  This document was prepared using Dragon voice recognition software and may include unintentional dictation errors.     Merrily Brittle, MD 10/02/17 2223

## 2017-09-30 NOTE — Discharge Instructions (Signed)
It was a pleasure to take care of you today, and thank you for coming to our emergency department.  If you have any questions or concerns before leaving please ask the nurse to grab me and I'm more than happy to go through your aftercare instructions again.  If you were prescribed any opioid pain medication today such as Norco, Vicodin, Percocet, morphine, hydrocodone, or oxycodone please make sure you do not drive when you are taking this medication as it can alter your ability to drive safely.  If you have any concerns once you are home that you are not improving or are in fact getting worse before you can make it to your follow-up appointment, please do not hesitate to call [phone] and come back for further evaluation.  Merrily BrittleNeil Tamecka Milham, MD  Results for orders placed or performed during the hospital encounter of 09/30/17  Basic metabolic panel  Result Value Ref Range   Sodium 138 135 - 145 mmol/L   Potassium 3.8 3.5 - 5.1 mmol/L   Chloride 105 98 - 111 mmol/L   CO2 26 22 - 32 mmol/L   Glucose, Bld 93 70 - 99 mg/dL   BUN 14 6 - 20 mg/dL   Creatinine, Ser 4.091.14 0.61 - 1.24 mg/dL   Calcium 9.1 8.9 - 81.110.3 mg/dL   GFR calc non Af Amer >60 >60 mL/min   GFR calc Af Amer >60 >60 mL/min   Anion gap 7 5 - 15  CBC  Result Value Ref Range   WBC 11.5 (H) 3.8 - 10.6 K/uL   RBC 5.07 4.40 - 5.90 MIL/uL   Hemoglobin 15.9 13.0 - 18.0 g/dL   HCT 91.445.4 78.240.0 - 95.652.0 %   MCV 89.7 80.0 - 100.0 fL   MCH 31.4 26.0 - 34.0 pg   MCHC 35.0 32.0 - 36.0 g/dL   RDW 21.313.7 08.611.5 - 57.814.5 %   Platelets 245 150 - 440 K/uL  Troponin I  Result Value Ref Range   Troponin I <0.03 <0.03 ng/mL   Dg Chest 2 View  Result Date: 09/30/2017 CLINICAL DATA:  Chest pain. EXAM: CHEST - 2 VIEW COMPARISON:  None. FINDINGS: The cardiomediastinal contours are normal. Borderline hyperinflation and bronchial thickening. Pulmonary vasculature is normal. No consolidation, pleural effusion, or pneumothorax. No acute osseous abnormalities are seen.  IMPRESSION: Borderline hyperinflation with mild bronchial thickening can be seen with asthma, bronchitis, or smoking. Electronically Signed   By: Rubye OaksMelanie  Ehinger M.D.   On: 09/30/2017 02:12

## 2017-09-30 NOTE — ED Triage Notes (Signed)
Patient c/o medial chest pain described as pressure. Patient reports accompanying symptoms of dizziness.

## 2017-11-27 NOTE — Progress Notes (Signed)
Cardiology Office Note  Date:  11/29/2017   ID:  Justin Webb, DOB August 17, 1975, MRN [id number]  PCP:  Justin Boyden, MD   Chief Complaint  Patient presents with  . OTHER    ED chest pain no complaints today. Meds reviewed verbally with pt.    HPI:  Justin Webb is a 42 y.o. male Smoker anxiety Presents by referral from Justin Webb  for consultation of his chest pain  Seen previously at [facility], 2014 Previous office note reported anxiety, smoking, chronic chest pain Chest pain symptoms starting 2013, to the emergency room at that time, work-up negative  Reports having recurrent episode of chest pain 2 or 3 days after working in the yard In the ER 09/2017 for chest pain Symptoms started around 10:30 pm  substernal constant pressure-like moderate severity nonradiating chest pain that began about a day ago.   Was in ER midnight to 5 AM Work-up negative and was referred here Notes indicate he was given GI cocktail and symptoms improved  No chest pain in the past 2 months  Still smoking 5 to 6 cigarettes/day, 1 pack every few days  Active at baseline with no regular exercise program  EKG personally reviewed by myself on todays visit Shows normal sinus rhythm rate 95 bpm no significant ST or T wave changes  CT abdomen pelvis November 2013 images pulled up and reviewed with him in detail No aortic atherosclerosis noted  Mom with heart issues   PMH:   has a past medical history of Anxiety, Childhood asthma, Ex-smoker (quit 10/2012), Kidney stone (12/2011), Pancreatitis, and Testicular torsion (1995).  PSH:    Past Surgical History:  Procedure Laterality Date  . exercise treadmill  10/2012   WNL, excellent exercise tolerance  . testicular torsion  1995   left    Current Outpatient Medications  Medication Sig Dispense Refill  . busPIRone (BUSPAR) 5 MG tablet Take 1 tablet (5 mg total) by mouth 2 (two) times daily. First 4 days take once nightly (Patient  taking differently: Take 5 mg by mouth daily. ) 180 tablet 3  . ibuprofen (ADVIL,MOTRIN) 200 MG tablet Take 200 mg by mouth every 6 (six) hours as needed for pain.    Marland Kitchen LORazepam (ATIVAN) 0.5 MG tablet Take 1 tablet (0.5 mg total) by mouth 2 (two) times daily as needed. 30 tablet 3   No current facility-administered medications for this visit.      Allergies:   Patient has no known allergies.   Social History:  The patient  reports that he has been smoking cigarettes. He has a 2.50 pack-year smoking history. He has never used smokeless tobacco. He reports that he does not drink alcohol or use drugs.   Family History:   family history includes Diabetes in his maternal grandmother; Healthy in his father; Heart attack in his mother; Other in his paternal grandfather.    Review of Systems: ROS   PHYSICAL EXAM: VS:  BP 124/84 (BP Location: Right Arm, Patient Position: Sitting, Cuff Size: Large)   Pulse 95   Ht 6\' 1"  (1.854 m)   Wt 214 lb (97.1 kg)   BMI 28.23 kg/m  , BMI Body mass index is 28.23 kg/m. GEN: Well nourished, well developed, in no acute distress  HEENT: normal  Neck: no JVD, carotid bruits, or masses Cardiac: RRR; no murmurs, rubs, or gallops,no edema  Respiratory:  clear to auscultation bilaterally, normal work of breathing GI: soft, nontender, nondistended, + BS MS: no deformity  or atrophy  Skin: warm and dry, no rash Neuro:  Strength and sensation are intact Psych: euthymic mood, full affect   Recent Labs: 09/30/2017: BUN 14; Creatinine, Ser 1.14; Hemoglobin 15.9; Platelets 245; Potassium 3.8; Sodium 138    Lipid Panel Lab Results  Component Value Date   CHOL 151 05/25/2017   HDL 40.60 05/25/2017   LDLCALC 93 05/25/2017   TRIG 88.0 05/25/2017      Wt Readings from Last 3 Encounters:  11/29/17 214 lb (97.1 kg)  09/30/17 205 lb (93 kg)  05/25/17 208 lb 4 oz (94.5 kg)      ASSESSMENT AND PLAN:  Other chest pain - Plan: EKG 12-Lead Long discussion  concerning his recent chest pain Atypical symptoms presenting after working in his garden Previous work-up 2013, 2014, again having atypical symptoms Only risk factor is moderate smoking Cholesterol is very low, no diabetes Stress test would likely be low yield.  Reasons discussed with him Recommended he could consider CT coronary calcium scoring for recurrent symptoms He has not had any symptoms in 2 months He will call us if he would like this done Discussed the difference between musculoskeletal pain and cardiac etiology He seemed comfortable with not ordering any scanning at this time  Smoker We have encouraged him to continue to work on weaning his cigarettes and smoking cessation. He will continue to work on this and does not want any assistance with chantix.   Anxiety state Recommend he follow-up with primary care Reassurance provided today  Disposition:   F/U as needed   Total encounter time more than 45 minutes  Greater than 50% was spent in counseling and coordination of care with the patient    Orders Placed This Encounter  Procedures  . EKG 12-Lead     Signed, Justin Webb, M.D., Ph.D. 11/29/2017  [facility] [place], [place] [phone]

## 2017-11-29 ENCOUNTER — Encounter: Payer: Self-pay | Admitting: Cardiovascular Disease

## 2017-11-29 ENCOUNTER — Ambulatory Visit: Payer: BLUE CROSS/BLUE SHIELD | Admitting: Cardiovascular Disease

## 2017-11-29 VITALS — BP 124/84 | HR 95 | Ht 73.0 in | Wt 214.0 lb

## 2017-11-29 DIAGNOSIS — F411 Generalized anxiety disorder: Secondary | ICD-10-CM

## 2017-11-29 DIAGNOSIS — R0789 Other chest pain: Secondary | ICD-10-CM | POA: Diagnosis not present

## 2017-11-29 DIAGNOSIS — F17200 Nicotine dependence, unspecified, uncomplicated: Secondary | ICD-10-CM

## 2017-11-29 NOTE — Patient Instructions (Addendum)
Medication Instructions:   No medication changes made  Labwork:  No new labs needed  Testing/Procedures:  No further testing at this time  Call if you would like a CT coronary calcium score $150, [place]    Follow-Up: It was a pleasure seeing you in the office today. Please call us if you have new issues that need to be addressed before your next appt.  [phone]  Your physician wants you to follow-up in:  As needed  If you need a refill on your cardiac medications before your next appointment, please call your pharmacy.  For educational health videos Log in to : [email or web address] Or : [email or web address], password : [email or web address]

## 2017-12-02 ENCOUNTER — Other Ambulatory Visit: Payer: Self-pay | Admitting: Family Medicine

## 2017-12-02 NOTE — Telephone Encounter (Signed)
Name of Medication: Lorazepam Name of Pharmacy: [facility] Last Fill or Written Date and Quantity: 10/19/17, #30 Last Office Visit and Type: 05/25/17, CPE Next Office Visit and Type: 05/31/18, CPE Last Controlled Substance Agreement Date: none Last UDS: none

## 2017-12-03 NOTE — Telephone Encounter (Signed)
Eprescribed.

## 2018-01-05 ENCOUNTER — Other Ambulatory Visit: Payer: Self-pay | Admitting: Family Medicine

## 2018-01-05 NOTE — Telephone Encounter (Signed)
Name of Medication: Lorazepam Name of Pharmacy: [facility] Last Fill or Written Date and Quantity: 12/03/17, #30/0 Last Office Visit and Type: 05/25/17, CPE Next Office Visit and Type: 05/31/18, CPE Last Controlled Substance Agreement Date: none Last UDS: none

## 2018-01-07 NOTE — Telephone Encounter (Signed)
Eprescribed.

## 2018-02-16 ENCOUNTER — Other Ambulatory Visit: Payer: Self-pay | Admitting: Family Medicine

## 2018-02-16 NOTE — Telephone Encounter (Signed)
Name of Medication: Lorazepam Name of Pharmacy: [facility] Last Fill or Written Date and Quantity: 01/07/18, #30/0 Last Office Visit and Type: 05/25/17, CPE Next Office Visit and Type: 05/31/18, CPE Last Controlled Substance Agreement Date: none Last UDS: none

## 2018-02-16 NOTE — Telephone Encounter (Signed)
Eprescribed.

## 2018-03-23 ENCOUNTER — Other Ambulatory Visit: Payer: Self-pay | Admitting: Family Medicine

## 2018-03-23 NOTE — Telephone Encounter (Signed)
Name of Medication: Lorazepam Name of Pharmacy: [facility] Last Fill or Written Date and Quantity: 02/16/18, #30/0 Last Office Visit and Type: 05/25/17, CPE Next Office Visit and Type: 05/31/18, CPE Last Controlled Substance Agreement Date: none Last UDS: none

## 2018-03-23 NOTE — Telephone Encounter (Signed)
plz phone in due to E prescribing error.  

## 2018-03-23 NOTE — Telephone Encounter (Signed)
Eprescribed.

## 2018-03-24 NOTE — Telephone Encounter (Signed)
Rx called to pharmacy as instructed. 

## 2018-04-24 ENCOUNTER — Other Ambulatory Visit: Payer: Self-pay | Admitting: Family Medicine

## 2018-04-24 NOTE — Telephone Encounter (Signed)
Eprescribed.

## 2018-04-24 NOTE — Telephone Encounter (Signed)
Name of Medication: lorazepam 0.5 mg Name of Pharmacy: [facility]  Last Fill or Written Date and Quantity: #30 on 03/23/18 Last Office Visit and Type:05/25/2017 annual  Next Office Visit and Type: 05/31/18 CPX Last Controlled Substance Agreement Date: none Last PYP:PJKD

## 2018-05-23 ENCOUNTER — Telehealth: Payer: Self-pay | Admitting: Family Medicine

## 2018-05-23 NOTE — Telephone Encounter (Signed)
Left message asking pt to call office r/s cpx °

## 2018-05-30 ENCOUNTER — Other Ambulatory Visit: Payer: BLUE CROSS/BLUE SHIELD

## 2018-05-31 ENCOUNTER — Encounter: Payer: BLUE CROSS/BLUE SHIELD | Admitting: Family Medicine

## 2018-05-31 ENCOUNTER — Other Ambulatory Visit: Payer: Self-pay | Admitting: Family Medicine

## 2018-06-01 NOTE — Telephone Encounter (Signed)
Eprescribed.

## 2018-06-01 NOTE — Telephone Encounter (Signed)
Last office visit 05/25/2017 for CPE. Last refilled 04/24/2018 for #30 with no refills.  CPE scheduled for 09/13/2018.  No UDS/Contract.

## 2018-07-04 ENCOUNTER — Other Ambulatory Visit: Payer: Self-pay | Admitting: Family Medicine

## 2018-07-04 NOTE — Telephone Encounter (Signed)
Name of Medication: Lorazepam Name of Pharmacy: [facility] Last Fill or Written Date and Quantity: 06/01/18, #30 Last Office Visit and Type: 05/25/17, CPE Next Office Visit and Type: 09/13/18, CPE Last Controlled Substance Agreement Date: none Last UDS: none

## 2018-07-06 NOTE — Telephone Encounter (Signed)
Eprescribed.

## 2018-08-03 ENCOUNTER — Other Ambulatory Visit: Payer: Self-pay | Admitting: Family Medicine

## 2018-08-03 NOTE — Telephone Encounter (Signed)
Eprescribed.

## 2018-08-03 NOTE — Telephone Encounter (Signed)
Name of Medication: Lorazepam Name of Pharmacy: [facility] Last Fill or Written Date and Quantity: 07/06/2018, #30 Last Office Visit and Type: 05/25/17, CPE Next Office Visit and Type: 09/13/18, CPE Last Controlled Substance Agreement Date: none Last UDS: none

## 2018-09-05 ENCOUNTER — Other Ambulatory Visit: Payer: Self-pay | Admitting: Family Medicine

## 2018-09-05 DIAGNOSIS — Z131 Encounter for screening for diabetes mellitus: Secondary | ICD-10-CM

## 2018-09-06 ENCOUNTER — Other Ambulatory Visit (INDEPENDENT_AMBULATORY_CARE_PROVIDER_SITE_OTHER): Payer: BC Managed Care – PPO

## 2018-09-06 ENCOUNTER — Other Ambulatory Visit: Payer: Self-pay

## 2018-09-06 DIAGNOSIS — Z131 Encounter for screening for diabetes mellitus: Secondary | ICD-10-CM | POA: Diagnosis not present

## 2018-09-07 ENCOUNTER — Telehealth: Payer: Self-pay | Admitting: Family Medicine

## 2018-09-07 LAB — BASIC METABOLIC PANEL
BUN: 15 mg/dL (ref 6–23)
CO2: 28 mEq/L (ref 19–32)
Calcium: 9.4 mg/dL (ref 8.4–10.5)
Chloride: 104 mEq/L (ref 96–112)
Creatinine, Ser: 1.08 mg/dL (ref 0.40–1.50)
GFR: 75.25 mL/min (ref >60.00)
Glucose, Bld: 79 mg/dL (ref 70–99)
Potassium: 4.5 mEq/L (ref 3.5–5.1)
Sodium: 139 mEq/L (ref 135–145)

## 2018-09-07 NOTE — Telephone Encounter (Signed)
Attempted to return pt's call.   Left vm asking pt to call back.  Need to relay lab results.

## 2018-09-07 NOTE — Telephone Encounter (Signed)
Patient returned your call in regards to his lab results  

## 2018-09-07 NOTE — Telephone Encounter (Signed)
Pt returning call.  Pt aware of results.  [See Labs, Result notes, 09/06/18.]

## 2018-09-13 ENCOUNTER — Encounter: Payer: BLUE CROSS/BLUE SHIELD | Admitting: Family Medicine

## 2018-09-25 ENCOUNTER — Encounter: Payer: Self-pay | Admitting: Family Medicine

## 2018-09-25 ENCOUNTER — Ambulatory Visit (INDEPENDENT_AMBULATORY_CARE_PROVIDER_SITE_OTHER): Payer: BC Managed Care – PPO | Admitting: Family Medicine

## 2018-09-25 ENCOUNTER — Other Ambulatory Visit: Payer: Self-pay

## 2018-09-25 VITALS — BP 116/70 | HR 92 | Temp 98.2°F | Ht 72.0 in | Wt 209.4 lb

## 2018-09-25 DIAGNOSIS — Z0000 Encounter for general adult medical examination without abnormal findings: Secondary | ICD-10-CM

## 2018-09-25 DIAGNOSIS — F17200 Nicotine dependence, unspecified, uncomplicated: Secondary | ICD-10-CM

## 2018-09-25 DIAGNOSIS — F411 Generalized anxiety disorder: Secondary | ICD-10-CM | POA: Diagnosis not present

## 2018-09-25 MED ORDER — BUSPIRONE HCL 5 MG PO TABS: 5.0000 mg | ORAL_TABLET | Freq: Every day | ORAL | 1 refills | Status: DC

## 2018-09-25 NOTE — Assessment & Plan Note (Signed)
Continue to encourage cessation. 

## 2018-09-25 NOTE — Progress Notes (Signed)
This visit was conducted in person.  BP 116/70 (BP Location: Left Arm, Patient Position: Sitting, Cuff Size: Normal)    Pulse 92    Temp 98.2 F (36.8 C) (Temporal)    Ht 6' (1.829 m)    Wt 209 lb 7 oz (95 kg)    SpO2 96%    BMI 28.40 kg/m    CC: CPE Subjective:    Patient ID: Justin Webb, male    DOB: [birth year], 43 y.o.   MRN: [id number]  HPI: Justin Webb is a 43 y.o. male presenting on 09/25/2018 for Annual Exam   Anxiety state - due to work stressors ([place]) overall managing well. buspar helped some but no longer taking this. Ongoing stress - at times overwhelming. Persistent trouble sleeping - takes lorazepam PRN stress/anxiety at work but sedating. Recently has started taking melatonin 5mg  nightly with benefit.   Preventative: Flu shot yearly Td 2011 Seat belt use discussed Sunscreen use discussed. No changing moles on skin.  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 Interim medical history since our last visit reviewed. Allergies and medications reviewed and updated. Outpatient Medications Prior to Visit  Medication Sig Dispense Refill   ibuprofen (ADVIL,MOTRIN) 200 MG tablet Take 200 mg by mouth every 6 (six) hours as needed for pain.     LORazepam (ATIVAN) 0.5 MG tablet TAKE 1 TABLET BY MOUTH TWICE A DAY AS NEEDED 30 tablet 0   Melatonin 5 MG TABS Take 1 tablet by mouth at bedtime.     busPIRone (BUSPAR) 5 MG tablet Take 1 tablet (5 mg total) by mouth 2 (two) times daily. First 4 days take once nightly (Patient taking differently: Take 5 mg by mouth daily.  ) 180 tablet 3   No facility-administered medications prior to visit.      Per HPI unless specifically indicated in ROS section below Review of Systems  Constitutional: Negative for activity change, appetite change, chills, fatigue, fever and unexpected weight change.  HENT: Negative for hearing loss.   Eyes: Negative for visual disturbance.  Respiratory: Negative for cough, chest tightness, shortness of breath and wheezing.   Cardiovascular: Negative for chest pain, palpitations and leg swelling.  Gastrointestinal: Negative for abdominal distention, abdominal pain, blood in stool, constipation, diarrhea, nausea and vomiting.  Genitourinary: Negative for difficulty urinating and hematuria.  Musculoskeletal: Negative for arthralgias, myalgias and neck pain.  Skin: Negative for rash.  Neurological: Negative for dizziness, seizures, syncope and headaches.  Hematological: Negative for adenopathy. Does not bruise/bleed easily.  Psychiatric/Behavioral: Negative for dysphoric mood. The patient is nervous/anxious.    Objective:    BP 116/70 (BP Location: Left Arm, Patient Position: Sitting, Cuff Size: Normal)    Pulse 92    Temp 98.2 F (36.8 C) (Temporal)    Ht 6' (1.829 m)    Wt 209 lb 7 oz (95 kg)    SpO2 96%    BMI 28.40 kg/m   Wt Readings from Last 3 Encounters:  09/25/18 209 lb 7 oz (95 kg)  11/29/17 214 lb (97.1 kg)  09/30/17 205 lb (93 kg)  Physical Exam Vitals signs and nursing note reviewed.  Constitutional:      General: He is not in acute distress.    Appearance: Normal appearance. He is well-developed. He is not ill-appearing.  HENT:     Head: Normocephalic and atraumatic.     Right Ear: Hearing, tympanic membrane, ear canal and external ear normal.     Left Ear: Hearing, tympanic membrane, ear canal and external ear normal.     Nose: Nose normal.     Mouth/Throat:     Mouth: Mucous membranes are moist.     Pharynx: Uvula midline. No oropharyngeal exudate or posterior  oropharyngeal erythema.  Eyes:     General: No scleral icterus.    Conjunctiva/sclera: Conjunctivae normal.     Pupils: Pupils are equal, round, and reactive to light.  Neck:     Musculoskeletal: Normal range of motion and neck supple.  Cardiovascular:     Rate and Rhythm: Normal rate and regular rhythm.     Pulses: Normal pulses.          Radial pulses are 2+ on the right side and 2+ on the left side.     Heart sounds: Normal heart sounds. No murmur.  Pulmonary:     Effort: Pulmonary effort is normal. No respiratory distress.     Breath sounds: Normal breath sounds. No wheezing, rhonchi or rales.  Abdominal:     General: Bowel sounds are normal. There is no distension.     Palpations: Abdomen is soft. There is no mass.     Tenderness: There is no abdominal tenderness. There is no guarding or rebound.     Hernia: No hernia is present.  Musculoskeletal: Normal range of motion.  Lymphadenopathy:     Cervical: No cervical adenopathy.  Skin:    General: Skin is warm and dry.     Findings: No rash.  Neurological:     General: No focal deficit present.     Mental Status: He is alert and oriented to person, place, and time.     Comments: CN grossly intact, station and gait intact  Psychiatric:        Mood and Affect: Mood normal.        Behavior: Behavior normal.        Thought Content: Thought content normal.        Judgment: Judgment normal.       Results for orders placed or performed in visit on 09/06/18  Basic metabolic panel  Result Value Ref Range   Sodium 139 135 - 145 mEq/L   Potassium 4.5 3.5 - 5.1 mEq/L   Chloride 104 96 - 112 mEq/L   CO2 28 19 - 32 mEq/L   Glucose, Bld 79 70 - 99 mg/dL   BUN 15 6 - 23 mg/dL   Creatinine, Ser 4.541.08 0.40 - 1.50 mg/dL   Calcium 9.4 8.4 - 09.810.5 mg/dL   GFR 11.9175.25 >47.82>60.00 mL/min   Lab Results  Component Value Date   CHOL 151 05/25/2017   HDL 40.60 05/25/2017   LDLCALC 93 05/25/2017   TRIG 88.0 05/25/2017   CHOLHDL 4 05/25/2017      Depression screen PHQ 2/9 09/25/2018 05/25/2017 09/08/2016  Decreased Interest 0 1 0  Down, Depressed, Hopeless 0 2 0  PHQ - 2 Score 0 3 0  Altered sleeping - 1 -  Tired, decreased energy - 3 -  Change in appetite - 0 -  Feeling bad or failure about yourself  -  1 -  Trouble concentrating - 2 -  Moving slowly or fidgety/restless - 0 -  Suicidal thoughts - 0 -  PHQ-9 Score - 10 -    GAD 7 : Generalized Anxiety Score 09/25/2018 09/08/2016  Nervous, Anxious, on Edge 3 3  Control/stop worrying 3 3  Worry too much - different things 3 3  Trouble relaxing 2 3  Restless 1 2  Easily annoyed or irritable 2 2  Afraid - awful might happen 2 2  Total GAD 7 Score 16 18  Anxiety Difficulty Somewhat difficult -   Assessment & Plan:   Problem List Items Addressed This Visit    Smoker    Continue to encourage cessation.       Health maintenance examination - Primary    Preventative protocols reviewed and updated unless pt declined. Discussed healthy diet and lifestyle.       GAD (generalized anxiety disorder)    Ongoing anxiety. Supportive care reviewed. Refilled buspar per pt request - he felt 5mg  once daily was effective. Discussed PRN lorazepam use. Pt aware of habit forming nature of med. Continue melatonin nightly for sleep - getting good benefit from this       Relevant Medications   busPIRone (BUSPAR) 5 MG tablet       Meds ordered this encounter  Medications   busPIRone (BUSPAR) 5 MG tablet    Sig: Take 1 tablet (5 mg total) by mouth daily.    Dispense:  90 tablet    Refill:  1   No orders of the defined types were placed in this encounter.   Follow up plan: Return in about 1 year (around 09/25/2019) for annual exam, prior fasting for blood work.  Eustaquio BoydenJavier Liddy Deam, MD

## 2018-09-25 NOTE — Patient Instructions (Signed)
You are doing well today. buspar refilled to use if needed.  Continue melatonin nightly, lorazepam as needed during day.   Health Maintenance, Male Adopting a healthy lifestyle and getting preventive care are important in promoting health and wellness. Ask your health care provider about:  The right schedule for you to have regular tests and exams.  Things you can do on your own to prevent diseases and keep yourself healthy. What should I know about diet, weight, and exercise? Eat a healthy diet   Eat a diet that includes plenty of vegetables, fruits, low-fat dairy products, and lean protein.  Do not eat a lot of foods that are high in solid fats, added sugars, or sodium. Maintain a healthy weight Body mass index (BMI) is a measurement that can be used to identify possible weight problems. It estimates body fat based on height and weight. Your health care provider can help determine your BMI and help you achieve or maintain a healthy weight. Get regular exercise Get regular exercise. This is one of the most important things you can do for your health. Most adults should:  Exercise for at least 150 minutes each week. The exercise should increase your heart rate and make you sweat (moderate-intensity exercise).  Do strengthening exercises at least twice a week. This is in addition to the moderate-intensity exercise.  Spend less time sitting. Even light physical activity can be beneficial. Watch cholesterol and blood lipids Have your blood tested for lipids and cholesterol at 41 years of age, then have this test every 5 years. You may need to have your cholesterol levels checked more often if:  Your lipid or cholesterol levels are high.  You are older than 41 years of age.  You are at high risk for heart disease. What should I know about cancer screening? Many types of cancers can be detected early and may often be prevented. Depending on your health history and family history, you  may need to have cancer screening at various ages. This may include screening for:  Colorectal cancer.  Prostate cancer.  Skin cancer.  Lung cancer. What should I know about heart disease, diabetes, and high blood pressure? Blood pressure and heart disease  High blood pressure causes heart disease and increases the risk of stroke. This is more likely to develop in people who have high blood pressure readings, are of African descent, or are overweight.  Talk with your health care provider about your target blood pressure readings.  Have your blood pressure checked: ? Every 3-5 years if you are 6518-41 years of age. ? Every year if you are 41 years old or older.  If you are between the ages of 5365 and 1175 and are a current or former smoker, ask your health care provider if you should have a one-time screening for abdominal aortic aneurysm (AAA). Diabetes Have regular diabetes screenings. This checks your fasting blood sugar level. Have the screening done:  Once every three years after age 41 if you are at a normal weight and have a low risk for diabetes.  More often and at a younger age if you are overweight or have a high risk for diabetes. What should I know about preventing infection? Hepatitis B If you have a higher risk for hepatitis B, you should be screened for this virus. Talk with your health care provider to find out if you are at risk for hepatitis B infection. Hepatitis C Blood testing is recommended for:  Everyone born from [birth year]  through 1965.  Anyone with known risk factors for hepatitis C. Sexually transmitted infections (STIs)  You should be screened each year for STIs, including gonorrhea and chlamydia, if: ? You are sexually active and are younger than 41 years of age. ? You are older than 41 years of age and your health care provider tells you that you are at risk for this type of infection. ? Your sexual activity has changed since you were last screened, and you  are at increased risk for chlamydia or gonorrhea. Ask your health care provider if you are at risk.  Ask your health care provider about whether you are at high risk for HIV. Your health care provider may recommend a prescription medicine to help prevent HIV infection. If you choose to take medicine to prevent HIV, you should first get tested for HIV. You should then be tested every 3 months for as long as you are taking the medicine. Follow these instructions at home: Lifestyle  Do not use any products that contain nicotine or tobacco, such as cigarettes, e-cigarettes, and chewing tobacco. If you need help quitting, ask your health care provider.  Do not use street drugs.  Do not share needles.  Ask your health care provider for help if you need support or information about quitting drugs. Alcohol use  Do not drink alcohol if your health care provider tells you not to drink.  If you drink alcohol: ? Limit how much you have to 0-2 drinks a day. ? Be aware of how much alcohol is in your drink. In the [place] one drink equals one 12 oz bottle of beer (355 mL), one 5 oz glass of wine (148 mL), or one 1 oz glass of hard liquor (44 mL). General instructions  Schedule regular health, dental, and eye exams.  Stay current with your vaccines.  Tell your health care provider if: ? You often feel depressed. ? You have ever been abused or do not feel safe at home. Summary  Adopting a healthy lifestyle and getting preventive care are important in promoting health and wellness.  Follow your health care provider's instructions about healthy diet, exercising, and getting tested or screened for diseases.  Follow your health care provider's instructions on monitoring your cholesterol and blood pressure. This information is not intended to replace advice given to you by your health care provider. Make sure you discuss any questions you have with your health care provider. Document Released:  08/14/2007 Document Revised: 02/08/2018 Document Reviewed: 02/08/2018 Elsevier Patient Education  2020 [employer].

## 2018-09-25 NOTE — Assessment & Plan Note (Signed)
Ongoing anxiety. Supportive care reviewed. Refilled buspar per pt request - he felt 5mg  once daily was effective. Discussed PRN lorazepam use. Pt aware of habit forming nature of med. Continue melatonin nightly for sleep - getting good benefit from this

## 2018-09-25 NOTE — Assessment & Plan Note (Signed)
Preventative protocols reviewed and updated unless pt declined. Discussed healthy diet and lifestyle.  

## 2018-10-11 ENCOUNTER — Ambulatory Visit (INDEPENDENT_AMBULATORY_CARE_PROVIDER_SITE_OTHER): Payer: BC Managed Care – PPO | Admitting: Family Medicine

## 2018-10-11 ENCOUNTER — Encounter: Payer: Self-pay | Admitting: Family Medicine

## 2018-10-11 VITALS — Ht 72.0 in | Wt 209.0 lb

## 2018-10-11 DIAGNOSIS — R197 Diarrhea, unspecified: Secondary | ICD-10-CM

## 2018-10-11 MED ORDER — METRONIDAZOLE 500 MG PO TABS: 500.0000 mg | ORAL_TABLET | Freq: Three times a day (TID) | ORAL | 0 refills | Status: DC

## 2018-10-11 NOTE — Progress Notes (Signed)
Virtual visit completed through [employer]. Due to national recommendations of social distancing due to COVID-19, a virtual visit is felt to be most appropriate for this patient at this time. Reviewed limitations of a virtual visit.   Patient location: home Provider location: [place] at [facility], office If any vitals were documented, they were collected by patient at home unless specified below.    Ht 6' (1.829 m)   Wt 209 lb (94.8 kg)   BMI 28.35 kg/m    CC: abd pain, diarrhea Subjective:    Patient ID: Justin Webb, male    DOB: [birth year], 43 y.o.   MRN: [id number]  HPI: Justin Webb is a 43 y.o. male presenting on 10/11/2018 for Diarrhea (C/o diarrhea and minor abd cramping.  Started about 2 wks ago. Denies any fever, nausea or vomiting.  Tried Pepto Bismol, barely helpful. )   2 wk h/o abd discomfort associated with diarrhea. Intermittent diarrhea - loose and watery stools after every meal. Tried pepto bismol with some benefit. Has been working, today took the day off. Increased gassiness - more foul smelling than usual.   No noted blood or mucous in stool.  No fevers/chills, nausea/vomiting, cough, dyspnea, loss of taste/smell, ST, HA.  No sick contacts at home, no exposure to diarrhea.  No known Covid exposure.       Relevant past medical, surgical, family and social history reviewed and updated as indicated. Interim medical history since our last visit reviewed. Allergies and medications reviewed and updated. Outpatient Medications Prior to Visit  Medication Sig Dispense Refill  . busPIRone (BUSPAR) 5 MG tablet Take 1 tablet (5 mg total) by mouth daily. 90 tablet 1  . ibuprofen (ADVIL,MOTRIN) 200 MG tablet Take 200 mg by mouth every 6 (six) hours as needed for pain.    Marland Kitchen LORazepam (ATIVAN) 0.5 MG tablet TAKE 1 TABLET BY MOUTH TWICE A DAY AS NEEDED 30 tablet 0  . Melatonin 5 MG TABS Take 1 tablet by mouth at bedtime.     No facility-administered  medications prior to visit.      Per HPI unless specifically indicated in ROS section below Review of Systems Objective:    Ht 6' (1.829 m)   Wt 209 lb (94.8 kg)   BMI 28.35 kg/m   Wt Readings from Last 3 Encounters:  10/11/18 209 lb (94.8 kg)  09/25/18 209 lb 7 oz (95 kg)  11/29/17 214 lb (97.1 kg)     Physical exam: Gen: alert, NAD, not ill appearing Pulm: speaks in complete sentences without increased work of breathing Psych: normal mood, normal thought content      Results for orders placed or performed in visit on 75/10/25  Basic metabolic panel  Result Value Ref Range   Sodium 139 135 - 145 mEq/L   Potassium 4.5 3.5 - 5.1 mEq/L   Chloride 104 96 - 112 mEq/L   CO2 28 19 - 32 mEq/L   Glucose, Bld 79 70 - 99 mg/dL   BUN 15 6 - 23 mg/dL   Creatinine, Ser 1.08 0.40 - 1.50 mg/dL   Calcium 9.4 8.4 - 10.5 mg/dL   GFR 75.25 >60.00 mL/min   Assessment & Plan:   Problem List Items Addressed This Visit    Diarrhea of presumed infectious origin - Primary    2 wk h/o loose and watery diarrhea associated with abd discomfort. Concern for infectious diarrhea, r/o C diff - he will pick up stool test for this. Will go  ahead and treat with flagyl 10d course with EtOH precautions. Reviewed bland diet and importance of good hydration status.       Relevant Orders   C. difficile GDH and Toxin A/B       Meds ordered this encounter  Medications  . metroNIDAZOLE (FLAGYL) 500 MG tablet    Sig: Take 1 tablet (500 mg total) by mouth 3 (three) times daily.    Dispense:  30 tablet    Refill:  0   Orders Placed This Encounter  Procedures  . C. difficile GDH and Toxin A/B    Standing Status:   Future    Standing Expiration Date:   10/11/2019    I discussed the assessment and treatment plan with the patient. The patient was provided an opportunity to ask questions and all were answered. The patient agreed with the plan and demonstrated an understanding of the instructions. The  patient was advised to call back or seek an in-person evaluation if the symptoms worsen or if the condition fails to improve as anticipated.  Follow up plan: Return if symptoms worsen or fail to improve.  Eustaquio BoydenJavier Elmon Shader, MD

## 2018-10-11 NOTE — Assessment & Plan Note (Signed)
2 wk h/o loose and watery diarrhea associated with abd discomfort. Concern for infectious diarrhea, r/o C diff - he will pick up stool test for this. Will go ahead and treat with flagyl 10d course with EtOH precautions. Reviewed bland diet and importance of good hydration status.

## 2018-10-12 ENCOUNTER — Other Ambulatory Visit: Payer: Self-pay

## 2018-10-12 ENCOUNTER — Other Ambulatory Visit: Payer: BC Managed Care – PPO

## 2018-10-12 DIAGNOSIS — R197 Diarrhea, unspecified: Secondary | ICD-10-CM

## 2018-10-13 LAB — C. DIFFICILE GDH AND TOXIN A/B
GDH ANTIGEN: NOT DETECTED
MICRO NUMBER:: 768552
SPECIMEN QUALITY:: ADEQUATE
TOXIN A AND B: NOT DETECTED

## 2018-10-17 ENCOUNTER — Other Ambulatory Visit: Payer: Self-pay | Admitting: Family Medicine

## 2018-10-18 NOTE — Telephone Encounter (Signed)
Eprescribed.

## 2018-10-18 NOTE — Telephone Encounter (Signed)
Last office visit 10/11/2018 for diarrhea.  Last refilled 08/03/2018 for #30 with no refills.  CPE scheduled for 10/03/2019.

## 2018-12-18 ENCOUNTER — Other Ambulatory Visit: Payer: Self-pay | Admitting: Family Medicine

## 2018-12-19 NOTE — Telephone Encounter (Signed)
ERx 

## 2018-12-19 NOTE — Telephone Encounter (Signed)
Name of Medication: Lorazepam Name of Pharmacy: [facility] Last Fill or Written Date and Quantity: 10/18/18, #30 Last Office Visit and Type: 10/11/18, acute Next Office Visit and Type: 10/03/19, CPE Last Controlled Substance Agreement Date: none Last UDS: none

## 2019-01-23 ENCOUNTER — Other Ambulatory Visit: Payer: Self-pay | Admitting: Family Medicine

## 2019-01-24 NOTE — Telephone Encounter (Signed)
ERx 

## 2019-02-27 ENCOUNTER — Other Ambulatory Visit: Payer: Self-pay | Admitting: Family Medicine

## 2019-02-28 NOTE — Telephone Encounter (Signed)
Name of Medication: Lorazepam Name of Pharmacy: [facility] Last Fill or Written Date and Quantity: 01/24/19, #30 Last Office Visit and Type: 10/11/18 acute diarrhea; 09/25/18, CPE Next Office Visit and Type: 10/03/19, CPE Last Controlled Substance Agreement Date: none Last UDS: none

## 2019-02-28 NOTE — Telephone Encounter (Signed)
ERx 

## 2019-03-26 ENCOUNTER — Other Ambulatory Visit: Payer: Self-pay | Admitting: Family Medicine

## 2019-04-02 ENCOUNTER — Other Ambulatory Visit: Payer: Self-pay | Admitting: Family Medicine

## 2019-04-02 NOTE — Telephone Encounter (Signed)
Last refilled on 02/28/19 for #30 with 0 refills. Patient was last seen for a CPE on 09/25/18. Ok to refill?

## 2019-04-02 NOTE — Telephone Encounter (Signed)
ERx 

## 2019-04-26 ENCOUNTER — Encounter: Payer: Self-pay | Admitting: Family Medicine

## 2019-04-26 ENCOUNTER — Other Ambulatory Visit: Payer: Self-pay

## 2019-04-26 ENCOUNTER — Ambulatory Visit
Admission: RE | Admit: 2019-04-26 | Discharge: 2019-04-26 | Disposition: A | Discharge status: Home / Self Care | Payer: BC Managed Care – PPO | Source: Ambulatory Visit | Attending: Family Medicine | Admitting: Family Medicine

## 2019-04-26 ENCOUNTER — Ambulatory Visit: Payer: BC Managed Care – PPO | Admitting: Family Medicine

## 2019-04-26 VITALS — BP 110/80 | HR 93 | Temp 98.3°F | Ht 72.0 in | Wt 212.5 lb

## 2019-04-26 DIAGNOSIS — M79604 Pain in right leg: Secondary | ICD-10-CM | POA: Diagnosis present

## 2019-04-26 NOTE — Progress Notes (Signed)
Collins Dimaria T. Valentino Saavedra, MD Primary Care and [place] at [facility] [place], [address] Phone: [phone]  FAX: [phone]  Justin Webb III - 44 y.o. male  MRN [id number]  Date of Birth: 1976-01-31  Visit Date: 04/26/2019  PCP: Ria Bush, MD  Referred by: Ria Bush, MD  Chief Complaint  Patient presents with  . Leg Pain    Right    This visit occurred during the SARS-CoV-2 public health emergency.  Safety protocols were in place, including screening questions prior to the visit, additional usage of staff PPE, and extensive cleaning of exam room while observing appropriate contact time as indicated for disinfecting solutions.   Subjective:   Wilfred Dayrit III is a 44 y.o. very [place] male patient with Body mass index is 28.82 kg/m. who presents with the following:  R leg pain:  Has had some problems with his feet for a couple of years.  Bought some new shoes and feet feel a lot better.  Has some pain in his right heel, but that is better.  Woke up at 2 AM in his calf.  Unsure about it and thought maybe a wrong angle.  Walked around and went back to bed.  Did not do anything to it at all.  He does have some ongoing pain in the right leg, and this is only been present in approximately the last 12 hours.  No trauma.  No bruising.  The pain is more proximal and is not in the region of the musculotendinous junction.  Review of Systems is noted in the HPI, as appropriate   Objective:   BP 110/80   Pulse 93   Temp 98.3 F (36.8 C) (Temporal)   Ht 6' (1.829 m)   Wt 212 lb 8 oz (96.4 kg)   SpO2 95%   BMI 28.82 kg/m   GEN: No acute distress; alert,appropriate. PULM: Breathing comfortably in no respiratory distress PSYCH: Normally interactive.    Right leg and knee.  Full extension and flexion to 125 degrees.  No crepitus at the patella and nontender at the medial lateral joint lines.  ACL, MCL,  LCL and PCL are all intact.  No edema at the pedal region on the right.  There is tenderness to palpation in the calf region, more popliteal area and proximally.  This is compared to the left as well  Radiology: US Venous Img Lower Unilateral Right (DVT)  Result Date: 04/26/2019 CLINICAL DATA:  Right lower extremity pain and warmth EXAM: RIGHT LOWER EXTREMITY VENOUS DUPLEX ULTRASOUND TECHNIQUE: Gray-scale sonography with graded compression, as well as color Doppler and duplex ultrasound were performed to evaluate the right lower extremity deep venous system from the level of the common femoral vein and including the common femoral, femoral, profunda femoral, popliteal and calf veins including the posterior tibial, peroneal and gastrocnemius veins when visible. The superficial great saphenous vein was also interrogated. Spectral Doppler was utilized to evaluate flow at rest and with distal augmentation maneuvers in the common femoral, femoral and popliteal veins. COMPARISON:  None. FINDINGS: Contralateral Common Femoral Vein: Respiratory phasicity is normal and symmetric with the symptomatic side. No evidence of thrombus. Normal compressibility. Common Femoral Vein: No evidence of thrombus. Normal compressibility, respiratory phasicity and response to augmentation. Saphenofemoral Junction: No evidence of thrombus. Normal compressibility and flow on color Doppler imaging. Profunda Femoral Vein: No evidence of thrombus. Normal compressibility and flow on color Doppler imaging. Femoral Vein: No  evidence of thrombus. Normal compressibility, respiratory phasicity and response to augmentation. Popliteal Vein: No evidence of thrombus. Normal compressibility, respiratory phasicity and response to augmentation. Calf Veins: No evidence of thrombus. Normal compressibility and flow on color Doppler imaging. Superficial Great Saphenous Vein: No evidence of thrombus. Normal compressibility. Venous Reflux:  None. Other  Findings:  None. IMPRESSION: No evidence of deep venous thrombosis in the right lower extremity. Left common femoral vein also patent. Electronically Signed   By: Bretta Bang III M.D.   On: 04/26/2019 16:02     Assessment and Plan:     ICD-10-CM   1. Right leg pain  M79.604 US Venous Img Lower Unilateral Right (DVT)    CANCELED: US Venous Img Lower Bilateral   My primary concern here was a possible DVT in this region.  At this point the patient's venous duplex has returned and he does not have DVT.  Presumable muscular injury of some unknown type, and recommended some mild heat, anti-inflammatories, and gentle stretching.  Follow-up: No follow-ups on file.  No orders of the defined types were placed in this encounter.  Medications Discontinued During This Encounter  Medication Reason  . metroNIDAZOLE (FLAGYL) 500 MG tablet Completed Course   Orders Placed This Encounter  Procedures  . US Venous Img Lower Unilateral Right (DVT)    Signed,  Adra Shepler T. Fradel Baldonado, MD   Outpatient Encounter Medications as of 04/26/2019  Medication Sig  . busPIRone (BUSPAR) 5 MG tablet TAKE 1 TABLET BY MOUTH EVERY DAY  . ibuprofen (ADVIL,MOTRIN) 200 MG tablet Take 200 mg by mouth every 6 (six) hours as needed for pain.  Marland Kitchen LORazepam (ATIVAN) 0.5 MG tablet TAKE 1 TABLET BY MOUTH TWICE A DAY AS NEEDED  . Melatonin 5 MG TABS Take 1 tablet by mouth at bedtime.  . [DISCONTINUED] metroNIDAZOLE (FLAGYL) 500 MG tablet Take 1 tablet (500 mg total) by mouth 3 (three) times daily.   No facility-administered encounter medications on file as of 04/26/2019.

## 2019-05-04 ENCOUNTER — Other Ambulatory Visit: Payer: Self-pay | Admitting: Family Medicine

## 2019-05-04 NOTE — Telephone Encounter (Signed)
ERx 

## 2019-05-08 ENCOUNTER — Encounter: Payer: Self-pay | Admitting: Family Medicine

## 2019-05-08 ENCOUNTER — Other Ambulatory Visit: Payer: Self-pay

## 2019-05-08 ENCOUNTER — Ambulatory Visit: Payer: BC Managed Care – PPO | Admitting: Family Medicine

## 2019-05-08 DIAGNOSIS — M5431 Sciatica, right side: Secondary | ICD-10-CM | POA: Diagnosis not present

## 2019-05-08 MED ORDER — GABAPENTIN 300 MG PO CAPS: 300.0000 mg | ORAL_CAPSULE | Freq: Two times a day (BID) | ORAL | 1 refills | Status: DC

## 2019-05-08 NOTE — Progress Notes (Addendum)
This visit was conducted in person.  BP 102/70 (BP Location: Right Arm, Patient Position: Sitting, Cuff Size: Normal)   Pulse (!) 108   Temp 98.6 F (37 C) (Temporal)   Ht 6' (1.829 m)   Wt 215 lb 7 oz (97.7 kg)   SpO2 96%   BMI 29.22 kg/m    CC: R leg pain Subjective:    Patient ID: Justin Webb, male    DOB: 1975/05/04, 44 y.o.   MRN: [id number]  HPI: Justin Webb is a 44 y.o. male presenting on 05/08/2019 for Leg Pain (Seen on 04/25/18 for right leg pain.  Also, c/o right low back pain radiating down leg to foot. States it is not improving. )   Pain started 04/26/2019.  Saw Dr Lorelei Pont 2 wks ago with R leg pain at calf s/p reassuring venous US negative for DVT, treated as MSK injury with mild heat, NSAIDs, gentle stretching. No benefit with this.   Saw other physician who treated him with 6d steroid taper with benefit but then symptoms have started returning. Ibuprofen also helps some.   R>L buttock, lower back, posterior thigh pain, pain shoots down leg. Started in calf, now more upper leg. Also burning and sharp pain in L heel. Some R leg paresthesias. Yesterday started feeling symptoms in left leg.   Better when supine, worse with prolonged upright/walking. Actually when acutely painful, standing feels better than sitting.   No bowel/bladder accidents, saddle anesthesia, fevers/chills, denies inciting trauma/injury or falls.      Relevant past medical, surgical, family and social history reviewed and updated as indicated. Interim medical history since our last visit reviewed. Allergies and medications reviewed and updated. Outpatient Medications Prior to Visit  Medication Sig Dispense Refill  . busPIRone (BUSPAR) 5 MG tablet TAKE 1 TABLET BY MOUTH EVERY DAY 90 tablet 1  . ibuprofen (ADVIL,MOTRIN) 200 MG tablet Take 200 mg by mouth every 6 (six) hours as needed for pain.    Marland Kitchen LORazepam (ATIVAN) 0.5 MG tablet TAKE 1 TABLET BY MOUTH TWICE A DAY AS NEEDED 30  tablet 0  . Melatonin 5 MG TABS Take 1 tablet by mouth at bedtime.     No facility-administered medications prior to visit.     Per HPI unless specifically indicated in ROS section below Review of Systems Objective:    BP 102/70 (BP Location: Right Arm, Patient Position: Sitting, Cuff Size: Normal)   Pulse (!) 108   Temp 98.6 F (37 C) (Temporal)   Ht 6' (1.829 m)   Wt 215 lb 7 oz (97.7 kg)   SpO2 96%   BMI 29.22 kg/m   Wt Readings from Last 3 Encounters:  05/08/19 215 lb 7 oz (97.7 kg)  04/26/19 212 lb 8 oz (96.4 kg)  10/11/18 209 lb (94.8 kg)    Physical Exam Vitals and nursing note reviewed.  Constitutional:      Appearance: Normal appearance. He is not ill-appearing.  Musculoskeletal:        General: Normal range of motion.     Comments:  No pain midline spine Discomfort mid lumbar paraspinous m on right Neg SLR bilaterally - some hamstring tightness with R SLR. No pain with int/ext rotation at hip. Neg FABER. No pain at SIJ, GTB bilaterally.  Pain to palpation at right sciatic notch.   Skin:    General: Skin is warm and dry.     Findings: No rash.  Neurological:     Mental Status: He is  alert.     Sensory: Sensation is intact.     Motor: Motor function is intact.     Coordination: Coordination is intact.     Deep Tendon Reflexes:     Reflex Scores:      Patellar reflexes are 1+ on the right side and 1+ on the left side.      Achilles reflexes are 1+ on the right side and 1+ on the left side.    Comments: 5/5 strength BLE  Psychiatric:        Mood and Affect: Mood normal.        Behavior: Behavior normal.       Assessment & Plan:  This visit occurred during the SARS-CoV-2 public health emergency.  Safety protocols were in place, including screening questions prior to the visit, additional usage of staff PPE, and extensive cleaning of exam room while observing appropriate contact time as indicated for disinfecting solutions.   Problem List Items  Addressed This Visit    Right sided sciatica    Story/exam most consistent with R sciatica from piriformis syndrome - he just completed steroid taper with benefit. Discussed ibuprofen use (400-600mg  TID with meals for 1 wk then PRN), as well as will start gabapentin 300mg  BID course. Provided with stretching exercises for piriformis syndrome. Update if not improving for formal PT referral. Pt agrees with plan. No red flags noted today.       Relevant Medications   gabapentin (NEURONTIN) 300 MG capsule       Meds ordered this encounter  Medications  . gabapentin (NEURONTIN) 300 MG capsule    Sig: Take 1 capsule (300 mg total) by mouth in the morning and at bedtime.    Dispense:  60 capsule    Refill:  1   No orders of the defined types were placed in this encounter.   Patient Instructions  I think you have R sciatica from piriformis syndrome.  Treat with continued ibuprofen 400-600mg  with meals for 5-7 days.  Take gabapentin 300mg  at night time to help with sleep, after 3-4 days may increase to twice daily (caution it may make you sleepy).  Do exercises provided today.  If not improving with this in 1-2 weeks, let me know for formal physical therapy course.    Follow up plan: Return if symptoms worsen or fail to improve.  , MD

## 2019-05-08 NOTE — Assessment & Plan Note (Signed)
Story/exam most consistent with R sciatica from piriformis syndrome - he just completed steroid taper with benefit. Discussed ibuprofen use (400-600mg  TID with meals for 1 wk then PRN), as well as will start gabapentin 300mg  BID course. Provided with stretching exercises for piriformis syndrome. Update if not improving for formal PT referral. Pt agrees with plan. No red flags noted today.

## 2019-05-08 NOTE — Patient Instructions (Addendum)
I think you have R sciatica from piriformis syndrome.  Treat with continued ibuprofen 400-600mg  with meals for 5-7 days.  Take gabapentin 300mg  at night time to help with sleep, after 3-4 days may increase to twice daily (caution it may make you sleepy).  Do exercises provided today.  If not improving with this in 1-2 weeks, let me know for formal physical therapy course.

## 2019-06-13 ENCOUNTER — Other Ambulatory Visit: Payer: Self-pay

## 2019-06-13 NOTE — Telephone Encounter (Signed)
Name of Medication: Lorazepam Name of Pharmacy: [facility] Last Fill or Written Date and Quantity: 05/04/19, #30 Last Office Visit and Type: 05/08/19, #30 Next Office Visit and Type: 10/03/19, CPE Last Controlled Substance Agreement Date: none Last UDS: none

## 2019-06-15 ENCOUNTER — Other Ambulatory Visit: Payer: Self-pay | Admitting: Family Medicine

## 2019-06-15 ENCOUNTER — Other Ambulatory Visit: Payer: Self-pay

## 2019-06-15 MED ORDER — LORAZEPAM 0.5 MG PO TABS: 0.5000 mg | ORAL_TABLET | Freq: Two times a day (BID) | ORAL | 1 refills | Status: DC | PRN

## 2019-06-15 NOTE — Telephone Encounter (Signed)
Pt left v/m requesting cb for med question. Left v/m requesting pt cb to [facility].

## 2019-06-15 NOTE — Telephone Encounter (Addendum)
plz notify pt Rx filled.

## 2019-06-15 NOTE — Telephone Encounter (Signed)
Pt notified by phn and verbalizes understanding.

## 2019-06-15 NOTE — Telephone Encounter (Signed)
Pt called back pt is out of lorazepam 0.5 mg. Request refill to [facility]; pt usually takes 1 lorazepam 0.5 mg tab daily. Pt request cb when filled.  Name of Medication: Lorazepam 0.5 mg Name of Pharmacy: [facility] Last Fill or Written Date and Quantity:# 30 on03/06/2019 Last Office Visit and Type: 05/08/19 leg pain &  09/25/18 annual Next Office Visit and Type:10/03/19 CPX

## 2019-08-29 ENCOUNTER — Other Ambulatory Visit: Payer: Self-pay | Admitting: Family Medicine

## 2019-08-29 NOTE — Telephone Encounter (Signed)
ERx 

## 2019-09-12 ENCOUNTER — Other Ambulatory Visit: Payer: Self-pay | Admitting: Family Medicine

## 2019-09-13 NOTE — Telephone Encounter (Signed)
Gabapentin Last filled:  06/04/19, #60 Last OV:  05/08/19, right sided sciatica Next OV:  10/03/19, CPE

## 2019-09-26 ENCOUNTER — Other Ambulatory Visit: Payer: BC Managed Care – PPO

## 2019-09-26 ENCOUNTER — Other Ambulatory Visit: Payer: Self-pay | Admitting: Family Medicine

## 2019-09-26 DIAGNOSIS — Z13220 Encounter for screening for lipoid disorders: Secondary | ICD-10-CM

## 2019-09-26 DIAGNOSIS — Z131 Encounter for screening for diabetes mellitus: Secondary | ICD-10-CM

## 2019-10-03 ENCOUNTER — Encounter: Payer: BC Managed Care – PPO | Admitting: Family Medicine

## 2020-02-04 ENCOUNTER — Other Ambulatory Visit: Payer: Self-pay | Admitting: Family Medicine

## 2020-02-05 NOTE — Telephone Encounter (Signed)
ERx 

## 2020-02-05 NOTE — Telephone Encounter (Signed)
Pharmacy requests refill on: Gabapentin 300 mg   LAST REFILL: 09/14/2019 (Q-60, R-1)  LAST OV: 05/08/2019 NEXT OV: 12/17/23021 PHARMACY: [id number] [place], [place]   Pharmacy requests refill on: Lorazepam 0.5 mg   LAST REFILL: 08/29/2019 (Q-30, R-1) LAST OV: 05/08/2019 NEXT OV: 02/15/2020 PHARMACY: [id number] [place], [place]

## 2020-02-09 ENCOUNTER — Other Ambulatory Visit: Payer: Self-pay | Admitting: Family Medicine

## 2020-02-12 ENCOUNTER — Other Ambulatory Visit (INDEPENDENT_AMBULATORY_CARE_PROVIDER_SITE_OTHER): Payer: 59

## 2020-02-12 ENCOUNTER — Other Ambulatory Visit: Payer: Self-pay

## 2020-02-12 DIAGNOSIS — Z131 Encounter for screening for diabetes mellitus: Secondary | ICD-10-CM | POA: Diagnosis not present

## 2020-02-12 DIAGNOSIS — Z13220 Encounter for screening for lipoid disorders: Secondary | ICD-10-CM | POA: Diagnosis not present

## 2020-02-13 LAB — BASIC METABOLIC PANEL
BUN: 12 mg/dL (ref 6–23)
CO2: 27 mEq/L (ref 19–32)
Calcium: 8.7 mg/dL (ref 8.4–10.5)
Chloride: 106 mEq/L (ref 96–112)
Creatinine, Ser: 1.04 mg/dL (ref 0.40–1.50)
GFR: 88.44 mL/min (ref >60.00)
Glucose, Bld: 80 mg/dL (ref 70–99)
Potassium: 4 mEq/L (ref 3.5–5.1)
Sodium: 140 mEq/L (ref 135–145)

## 2020-02-13 LAB — LIPID PANEL
Cholesterol: 149 mg/dL (ref 0–200)
HDL: 41.5 mg/dL (ref >39.00)
LDL Cholesterol: 88 mg/dL (ref 0–99)
NonHDL: 107.69
Total CHOL/HDL Ratio: 4
Triglycerides: 96 mg/dL (ref 0.0–149.0)
VLDL: 19.2 mg/dL (ref 0.0–40.0)

## 2020-02-15 ENCOUNTER — Encounter: Payer: Self-pay | Admitting: Family Medicine

## 2020-02-15 ENCOUNTER — Other Ambulatory Visit: Payer: Self-pay

## 2020-02-15 ENCOUNTER — Ambulatory Visit (INDEPENDENT_AMBULATORY_CARE_PROVIDER_SITE_OTHER): Payer: 59 | Admitting: Family Medicine

## 2020-02-15 VITALS — BP 118/72 | HR 78 | Temp 98.1°F | Ht 71.75 in | Wt 212.2 lb

## 2020-02-15 DIAGNOSIS — M5431 Sciatica, right side: Secondary | ICD-10-CM

## 2020-02-15 DIAGNOSIS — Z0000 Encounter for general adult medical examination without abnormal findings: Secondary | ICD-10-CM

## 2020-02-15 DIAGNOSIS — F411 Generalized anxiety disorder: Secondary | ICD-10-CM | POA: Diagnosis not present

## 2020-02-15 DIAGNOSIS — F17200 Nicotine dependence, unspecified, uncomplicated: Secondary | ICD-10-CM

## 2020-02-15 DIAGNOSIS — Z23 Encounter for immunization: Secondary | ICD-10-CM

## 2020-02-15 MED ORDER — BUSPIRONE HCL 5 MG PO TABS: 5.0000 mg | ORAL_TABLET | Freq: Every day | ORAL | 3 refills | Status: DC

## 2020-02-15 MED ORDER — GABAPENTIN 300 MG PO CAPS: 300.0000 mg | ORAL_CAPSULE | Freq: Every day | ORAL | 3 refills | Status: DC

## 2020-02-15 NOTE — Assessment & Plan Note (Signed)
Continues nightly gabapentin 300mg 

## 2020-02-15 NOTE — Progress Notes (Signed)
Patient ID: [name], male    DOB: Nov 23, 1975, 44 y.o.   MRN: [id number]  This visit was conducted in person.  BP 118/72 (BP Location: Left Arm, Patient Position: Sitting, Cuff Size: Large)   Pulse 78   Temp 98.1 F (36.7 C) (Temporal)   Ht 5' 11.75" (1.822 m)   Wt 212 lb 3 oz (96.2 kg)   SpO2 98%   BMI 28.98 kg/m    CC: CPE Subjective:   HPI: Justin Webb is a 44 y.o. male presenting on 02/15/2020 for Annual Exam   Not regularly taking buspar Takes gabapentin 300mg  nightly for sciatica.  High stress currently at work - his employee is out on maternity leave and he is covering both her shift and his shift.   Preventative: Flu shot yearly  COVID vaccine - Pfizer x2 (will let know dates) Td 2011 Seat belt use discussed Sunscreen use discussed. No changing moles on skin.  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 Interim medical history since our last visit reviewed. Allergies and medications reviewed and updated. Outpatient Medications Prior to Visit  Medication Sig Dispense Refill  . ibuprofen (ADVIL,MOTRIN) 200 MG tablet Take 200 mg by mouth every 6 (six) hours as needed for pain.    Arts administrator LORazepam (ATIVAN) 0.5 MG tablet TAKE 1 TABLET (0.5 MG TOTAL) BY MOUTH 2 (TWO) TIMES DAILY AS NEEDED. 30 tablet 1  . Melatonin 5 MG TABS Take 1 tablet by mouth at bedtime.    . busPIRone (BUSPAR) 5 MG tablet TAKE 1 TABLET BY MOUTH EVERY DAY 90 tablet 1  . gabapentin (NEURONTIN) 300 MG capsule TAKE 1 CAPSULE (300 MG TOTAL) BY MOUTH IN THE MORNING AND AT BEDTIME. 60 capsule 1   No facility-administered medications  prior to visit.     Per HPI unless specifically indicated in ROS section below Review of Systems  Constitutional: Negative for activity change, appetite change, chills, fatigue, fever and unexpected weight change.  HENT: Negative for hearing loss.   Eyes: Negative for visual disturbance.  Respiratory: Negative for cough, chest tightness, shortness of breath and wheezing.   Cardiovascular: Negative for chest pain, palpitations and leg swelling.  Gastrointestinal: Positive for constipation (occ). Negative for abdominal distention, abdominal pain, blood in stool, diarrhea, nausea and vomiting.  Genitourinary: Negative for difficulty urinating and hematuria.  Musculoskeletal: Negative for arthralgias, myalgias and neck pain.  Skin: Negative for rash.  Neurological: Negative for dizziness, seizures, syncope and headaches.  Hematological: Negative for adenopathy. Does not bruise/bleed easily.  Psychiatric/Behavioral: Negative for dysphoric mood. The patient is nervous/anxious.    Objective:  BP 118/72 (BP Location: Left Arm, Patient Position: Sitting, Cuff Size: Large)   Pulse 78   Temp 98.1 F (36.7 C) (Temporal)   Ht 5' 11.75" (1.822 m)   Wt 212 lb 3 oz (96.2 kg)   SpO2 98%   BMI 28.98 kg/m   Wt Readings from Last 3 Encounters:  02/15/20 212 lb 3 oz (96.2 kg)  05/08/19 215 lb 7 oz (97.7 kg)  04/26/19 212 lb 8 oz (96.4 kg)      Physical Exam  Vitals and nursing note reviewed.  Constitutional:      General: He is not in acute distress.    Appearance: Normal appearance. He is well-developed and well-nourished. He is not ill-appearing.  HENT:     Head: Normocephalic and atraumatic.     Right Ear: Hearing, tympanic membrane, ear canal and external ear normal.     Left Ear: Hearing, tympanic membrane, ear canal and external ear normal.     Mouth/Throat:     Mouth: Oropharynx is clear and moist and mucous membranes are normal.     Pharynx: No posterior oropharyngeal edema.  Eyes:      General: No scleral icterus.    Extraocular Movements: Extraocular movements intact and EOM normal.     Conjunctiva/sclera: Conjunctivae normal.     Pupils: Pupils are equal, round, and reactive to light.  Neck:     Thyroid: No thyroid mass or thyromegaly.  Cardiovascular:     Rate and Rhythm: Normal rate and regular rhythm.     Pulses: Normal pulses and intact distal pulses.          Radial pulses are 2+ on the right side and 2+ on the left side.     Heart sounds: Normal heart sounds. No murmur heard.   Pulmonary:     Effort: Pulmonary effort is normal. No respiratory distress.     Breath sounds: Normal breath sounds. No wheezing, rhonchi or rales.  Abdominal:     General: Abdomen is flat. Bowel sounds are normal. There is no distension.     Palpations: Abdomen is soft. There is no mass.     Tenderness: There is no abdominal tenderness. There is no guarding or rebound.     Hernia: No hernia is present.  Musculoskeletal:        General: No edema. Normal range of motion.     Cervical back: Normal range of motion and neck supple.     Right lower leg: No edema.     Left lower leg: No edema.  Lymphadenopathy:     Cervical: No cervical adenopathy.  Skin:    General: Skin is warm and dry.     Findings: No rash.  Neurological:     Mental Status: He is alert and oriented to person, place, and time.     Comments: CN grossly intact, station and gait intact  Psychiatric:        Mood and Affect: Mood and affect and mood normal.        Behavior: Behavior normal.        Thought Content: Thought content normal.        Judgment: Judgment normal.       Results for orders placed or performed in visit on 02/12/20  Basic metabolic panel  Result Value Ref Range   Sodium 140 135 - 145 mEq/L   Potassium 4.0 3.5 - 5.1 mEq/L   Chloride 106 96 - 112 mEq/L   CO2 27 19 - 32 mEq/L   Glucose, Bld 80 70 - 99 mg/dL   BUN 12 6 - 23 mg/dL   Creatinine, Ser 7.25 0.40 - 1.50 mg/dL   GFR 36.64  >40.34 mL/min   Calcium 8.7 8.4 - 10.5 mg/dL  Lipid panel  Result Value Ref Range   Cholesterol 149 0 - 200 mg/dL   Triglycerides 74.2 0.0 - 149.0 mg/dL   HDL 59.56 >38.75 mg/dL   VLDL 64.3 0.0 - 32.9 mg/dL   LDL Cholesterol 88 0 - 99  mg/dL   Total CHOL/HDL Ratio 4    NonHDL 107.69    Depression screen [facility] 2/9 02/15/2020 09/25/2018 05/25/2017 09/08/2016  Decreased Interest 1 0 1 0  Down, Depressed, Hopeless 1 0 2 0  PHQ - 2 Score 2 0 3 0  Altered sleeping 1 - 1 -  Tired, decreased energy 1 - 3 -  Change in appetite 1 - 0 -  Feeling bad or failure about yourself  1 - 1 -  Trouble concentrating 1 - 2 -  Moving slowly or fidgety/restless 1 - 0 -  Suicidal thoughts 0 - 0 -  PHQ-9 Score 8 - 10 -    GAD 7 : Generalized Anxiety Score 02/15/2020 09/25/2018 09/08/2016  Nervous, Anxious, on Edge 2 3 3   Control/stop worrying 3 3 3   Worry too much - different things 3 3 3   Trouble relaxing 3 2 3   Restless 2 1 2   Easily annoyed or irritable 3 2 2   Afraid - awful might happen 2 2 2   Total GAD 7 Score 18 16 18   Anxiety Difficulty - Somewhat difficult -   Assessment & Plan:  This visit occurred during the SARS-CoV-2 public health emergency.  Safety protocols were in place, including screening questions prior to the visit, additional usage of staff PPE, and extensive cleaning of exam room while observing appropriate contact time as indicated for disinfecting solutions.   Problem List Items Addressed This Visit    Smoker    Continue to encourage cessation. Currently not at a place at this time.       Right sided sciatica    Continues nightly gabapentin 300mg       Relevant Medications   gabapentin (NEURONTIN) 300 MG capsule   busPIRone (BUSPAR) 5 MG tablet   Health maintenance examination - Primary    Preventative protocols reviewed and updated unless pt declined. Discussed healthy diet and lifestyle.       GAD (generalized anxiety disorder)    Discussed buspar use - rec daily use.  May continue lorazepam PRN.       Relevant Medications   busPIRone (BUSPAR) 5 MG tablet    Other Visit Diagnoses    Need for influenza vaccination       Relevant Orders   Flu Vaccine QUAD 36+ mos IM (Completed)       Meds ordered this encounter  Medications  . gabapentin (NEURONTIN) 300 MG capsule    Sig: Take 1 capsule (300 mg total) by mouth at bedtime.    Dispense:  90 capsule    Refill:  3  . busPIRone (BUSPAR) 5 MG tablet    Sig: Take 1 tablet (5 mg total) by mouth daily.    Dispense:  90 tablet    Refill:  3   Orders Placed This Encounter  Procedures  . Flu Vaccine QUAD 36+ mos IM    Patient instructions: Good to see you today  You are doing well today  I hope work slows down soon! Return as needed or in 1 year for next physical. Daily buspar will work better for anxiety.   Follow up plan: Return in about 1 year (around 02/14/2021) for annual exam, prior fasting for blood work.  , MD

## 2020-02-15 NOTE — Assessment & Plan Note (Signed)
Preventative protocols reviewed and updated unless pt declined. Discussed healthy diet and lifestyle.  

## 2020-02-15 NOTE — Patient Instructions (Addendum)
Good to see you today  You are doing well today  I hope work slows down soon! Return as needed or in 1 year for next physical. Daily buspar will work better for anxiety.   Health Maintenance, Male Adopting a healthy lifestyle and getting preventive care are important in promoting health and wellness. Ask your health care provider about:  The right schedule for you to have regular tests and exams.  Things you can do on your own to prevent diseases and keep yourself healthy. What should I know about diet, weight, and exercise? Eat a healthy diet   Eat a diet that includes plenty of vegetables, fruits, low-fat dairy products, and lean protein.  Do not eat a lot of foods that are high in solid fats, added sugars, or sodium. Maintain a healthy weight Body mass index (BMI) is a measurement that can be used to identify possible weight problems. It estimates body fat based on height and weight. Your health care provider can help determine your BMI and help you achieve or maintain a healthy weight. Get regular exercise Get regular exercise. This is one of the most important things you can do for your health. Most adults should:  Exercise for at least 150 minutes each week. The exercise should increase your heart rate and make you sweat (moderate-intensity exercise).  Do strengthening exercises at least twice a week. This is in addition to the moderate-intensity exercise.  Spend less time sitting. Even light physical activity can be beneficial. Watch cholesterol and blood lipids Have your blood tested for lipids and cholesterol at 42 years of age, then have this test every 5 years. You may need to have your cholesterol levels checked more often if:  Your lipid or cholesterol levels are high.  You are older than 42 years of age.  You are at high risk for heart disease. What should I know about cancer screening? Many types of cancers can be detected early and may often be prevented.  Depending on your health history and family history, you may need to have cancer screening at various ages. This may include screening for:  Colorectal cancer.  Prostate cancer.  Skin cancer.  Lung cancer. What should I know about heart disease, diabetes, and high blood pressure? Blood pressure and heart disease  High blood pressure causes heart disease and increases the risk of stroke. This is more likely to develop in people who have high blood pressure readings, are of African descent, or are overweight.  Talk with your health care provider about your target blood pressure readings.  Have your blood pressure checked: ? Every 3-5 years if you are 4-108 years of age. ? Every year if you are 37 years old or older.  If you are between the ages of 22 and 62 and are a current or former smoker, ask your health care provider if you should have a one-time screening for abdominal aortic aneurysm (AAA). Diabetes Have regular diabetes screenings. This checks your fasting blood sugar level. Have the screening done:  Once every three years after age 1 if you are at a normal weight and have a low risk for diabetes.  More often and at a younger age if you are overweight or have a high risk for diabetes. What should I know about preventing infection? Hepatitis B If you have a higher risk for hepatitis B, you should be screened for this virus. Talk with your health care provider to find out if you are at risk for  hepatitis B infection. Hepatitis C Blood testing is recommended for:  Everyone born from [birth year] through 1963.  Anyone with known risk factors for hepatitis C. Sexually transmitted infections (STIs)  You should be screened each year for STIs, including gonorrhea and chlamydia, if: ? You are sexually active and are younger than 42 years of age. ? You are older than 42 years of age and your health care provider tells you that you are at risk for this type of infection. ? Your sexual  activity has changed since you were last screened, and you are at increased risk for chlamydia or gonorrhea. Ask your health care provider if you are at risk.  Ask your health care provider about whether you are at high risk for HIV. Your health care provider may recommend a prescription medicine to help prevent HIV infection. If you choose to take medicine to prevent HIV, you should first get tested for HIV. You should then be tested every 3 months for as long as you are taking the medicine. Follow these instructions at home: Lifestyle  Do not use any products that contain nicotine or tobacco, such as cigarettes, e-cigarettes, and chewing tobacco. If you need help quitting, ask your health care provider.  Do not use street drugs.  Do not share needles.  Ask your health care provider for help if you need support or information about quitting drugs. Alcohol use  Do not drink alcohol if your health care provider tells you not to drink.  If you drink alcohol: ? Limit how much you have to 0-2 drinks a day. ? Be aware of how much alcohol is in your drink. In the [place] one drink equals one 12 oz bottle of beer (355 mL), one 5 oz glass of wine (148 mL), or one 1 oz glass of hard liquor (44 mL). General instructions  Schedule regular health, dental, and eye exams.  Stay current with your vaccines.  Tell your health care provider if: ? You often feel depressed. ? You have ever been abused or do not feel safe at home. Summary  Adopting a healthy lifestyle and getting preventive care are important in promoting health and wellness.  Follow your health care provider's instructions about healthy diet, exercising, and getting tested or screened for diseases.  Follow your health care provider's instructions on monitoring your cholesterol and blood pressure. This information is not intended to replace advice given to you by your health care provider. Make sure you discuss any questions you have  with your health care provider. Document Revised: 02/08/2018 Document Reviewed: 02/08/2018 Elsevier Patient Education  2020 [employer].

## 2020-02-15 NOTE — Assessment & Plan Note (Signed)
Discussed buspar use - rec daily use. May continue lorazepam PRN.

## 2020-02-15 NOTE — Assessment & Plan Note (Addendum)
Continue to encourage cessation. Currently not at a place at this time.

## 2020-09-20 ENCOUNTER — Other Ambulatory Visit: Payer: Self-pay | Admitting: Family Medicine

## 2020-09-22 NOTE — Telephone Encounter (Signed)
Name of Medication: Lorazepam Name of Pharmacy: [facility] Last Fill or Written Date and Quantity: 02/05/20, #30 Last Office Visit and Type: 02/15/20, CPE Next Office Visit and Type: none Last Controlled Substance Agreement Date: none Last UDS: none

## 2020-09-23 NOTE — Telephone Encounter (Signed)
ERx 

## 2020-11-17 ENCOUNTER — Encounter: Payer: Self-pay | Admitting: Family Medicine

## 2020-11-17 ENCOUNTER — Other Ambulatory Visit: Payer: Self-pay

## 2020-11-17 ENCOUNTER — Ambulatory Visit: Payer: 59 | Admitting: Family Medicine

## 2020-11-17 VITALS — BP 118/76 | HR 64 | Temp 97.9°F | Ht 71.25 in | Wt 219.0 lb

## 2020-11-17 DIAGNOSIS — R194 Change in bowel habit: Secondary | ICD-10-CM

## 2020-11-17 DIAGNOSIS — K5900 Constipation, unspecified: Secondary | ICD-10-CM | POA: Diagnosis not present

## 2020-11-17 DIAGNOSIS — K5901 Slow transit constipation: Secondary | ICD-10-CM | POA: Insufficient documentation

## 2020-11-17 MED ORDER — HYDROCORTISONE ACETATE 25 MG RE SUPP
25.0000 mg | Freq: Two times a day (BID) | RECTAL | 0 refills | Status: DC
Start: 2020-11-17 — End: 2021-03-23

## 2020-11-17 NOTE — Progress Notes (Signed)
Patient ID: Justin Webb, male    DOB: Jul 17, 1975, 45 y.o.   MRN: [id number]  This visit was conducted in person.  BP 118/76   Pulse 64   Temp 97.9 F (36.6 C) (Temporal)   Ht 5' 11.25" (1.81 m)   Wt 219 lb (99.3 kg)   SpO2 97%   BMI 30.33 kg/m    CC: GI issues Subjective:   HPI: Justin Webb is a 45 y.o. male presenting on 11/17/2020 for digestive issues   Notes increased constipation over the past several months - 1 BM every 2-3 days, constantly feels bloated and full, itching/burning discomfort perianally with BM, using prep-H. Straining with BMs. Small blood streaks when wiping, no blood in commode or in stools.   Has tried dulcolax with benefit.  Water intake - does well with this.  Fiber intake - limited, could do better  Nocturia x1 at night.   No fevers/chills, nausea/vomiting, abd pain or cramping. No night sweats, unexpected weight loss, fatigue   No fmhx colon cancer  Quit smoking! Exercising more.      Relevant past medical, surgical, family and social history reviewed and updated as indicated. Interim medical history since our last visit reviewed. Allergies and medications reviewed and updated. Outpatient Medications Prior to Visit  Medication Sig Dispense Refill   busPIRone (BUSPAR) 5 MG tablet Take 1 tablet (5 mg total) by mouth daily. 90 tablet 3   gabapentin (NEURONTIN) 300 MG capsule Take 1 capsule (300 mg total) by mouth at bedtime. 90 capsule 3   ibuprofen (ADVIL,MOTRIN) 200 MG tablet Take 200 mg by mouth every 6 (six) hours as needed for pain.     LORazepam (ATIVAN) 0.5 MG tablet TAKE 1 TABLET BY MOUTH 2 TIMES DAILY AS NEEDED. 30 tablet 0   Melatonin 5 MG TABS Take 1 tablet by mouth at bedtime.     No facility-administered medications prior to visit.     Per HPI unless specifically indicated in ROS section below Review of Systems  Objective:  BP 118/76   Pulse 64   Temp 97.9 F (36.6 C) (Temporal)   Ht 5' 11.25" (1.81 m)    Wt 219 lb (99.3 kg)   SpO2 97%   BMI 30.33 kg/m   Wt Readings from Last 3 Encounters:  11/17/20 219 lb (99.3 kg)  02/15/20 212 lb 3 oz (96.2 kg)  05/08/19 215 lb 7 oz (97.7 kg)      Physical Exam Vitals and nursing note reviewed.  Constitutional:      Appearance: Normal appearance. He is not ill-appearing.  HENT:     Mouth/Throat:     Mouth: Mucous membranes are moist.     Pharynx: Oropharynx is clear. No oropharyngeal exudate or posterior oropharyngeal erythema.  Eyes:     Extraocular Movements: Extraocular movements intact.     Pupils: Pupils are equal, round, and reactive to light.  Cardiovascular:     Rate and Rhythm: Normal rate and regular rhythm.     Pulses: Normal pulses.     Heart sounds: Normal heart sounds. No murmur heard. Pulmonary:     Effort: Pulmonary effort is normal. No respiratory distress.     Breath sounds: Normal breath sounds. No wheezing, rhonchi or rales.  Abdominal:     General: Bowel sounds are normal. There is no distension.     Palpations: Abdomen is soft. There is no mass.     Tenderness: There is no abdominal tenderness. There is no  right CVA tenderness, left CVA tenderness, guarding or rebound. Negative signs include Murphy's sign.     Hernia: No hernia is present.  Genitourinary:    Prostate: Not tender and no nodules present. Enlarged: ~25gm.    Rectum: Normal. No mass, tenderness, anal fissure, external hemorrhoid or internal hemorrhoid. Normal anal tone.     Comments: No obvious int/ext hemorrhoid or fissure Musculoskeletal:     Right lower leg: No edema.     Left lower leg: No edema.  Skin:    General: Skin is warm and dry.     Findings: No rash.  Neurological:     Mental Status: He is alert.  Psychiatric:        Mood and Affect: Mood normal.        Behavior: Behavior normal.      Results for orders placed or performed in visit on 02/12/20  Basic metabolic panel  Result Value Ref Range   Sodium 140 135 - 145 mEq/L    Potassium 4.0 3.5 - 5.1 mEq/L   Chloride 106 96 - 112 mEq/L   CO2 27 19 - 32 mEq/L   Glucose, Bld 80 70 - 99 mg/dL   BUN 12 6 - 23 mg/dL   Creatinine, Ser 9.83 0.40 - 1.50 mg/dL   GFR 38.25 >05.39 mL/min   Calcium 8.7 8.4 - 10.5 mg/dL  Lipid panel  Result Value Ref Range   Cholesterol 149 0 - 200 mg/dL   Triglycerides 76.7 0.0 - 149.0 mg/dL   HDL 34.19 >37.90 mg/dL   VLDL 24.0 0.0 - 97.3 mg/dL   LDL Cholesterol 88 0 - 99 mg/dL   Total CHOL/HDL Ratio 4    NonHDL 107.69     Assessment & Plan:  This visit occurred during the SARS-CoV-2 public health emergency.  Safety protocols were in place, including screening questions prior to the visit, additional usage of staff PPE, and extensive cleaning of exam room while observing appropriate contact time as indicated for disinfecting solutions.   Problem List Items Addressed This Visit     Constipation - Primary    Increase in constipation associated with perirectal irritation manifesting as burning/itching without obvious hemorrhoid or anal fissure on exam.  Reviewed diet and lifestyle changes to improve constipation. Internal hemorrhoid causing perianal irritation - will Rx steroid suppository.  Discussed water, fiber intake, fiber handout provided, recommend he start miralax daily + senna PRN.  If not improving with this, discussed referral to GI to consider colonoscopy with recent bowel habit changes associated with bloating. Pt agrees with plan.       Other Visit Diagnoses     Bowel habit changes            Meds ordered this encounter  Medications   hydrocortisone (ANUSOL-HC) 25 MG suppository    Sig: Place 1 suppository (25 mg total) rectally 2 (two) times daily.    Dispense:  12 suppository    Refill:  0    No orders of the defined types were placed in this encounter.   Patient instructions: Work on increased fiber in [employer].  Start miralax capful daily and could also use senna or senokot as needed.  May try  steroid suppository as needed.  If not better with above, let us know for GI referral to discuss colonoscopy.   Follow up plan: Return if symptoms worsen or fail to improve.  Eustaquio Boyden, MD

## 2020-11-17 NOTE — Assessment & Plan Note (Addendum)
Increase in constipation associated with perirectal irritation manifesting as burning/itching without obvious hemorrhoid or anal fissure on exam.  Reviewed diet and lifestyle changes to improve constipation. Internal hemorrhoid causing perianal irritation - will Rx steroid suppository.  Discussed water, fiber intake, fiber handout provided, recommend he start miralax daily + senna PRN.  If not improving with this, discussed referral to GI to consider colonoscopy with recent bowel habit changes associated with bloating. Pt agrees with plan.

## 2020-11-17 NOTE — Patient Instructions (Addendum)
Work on increased fiber in [employer].  Start miralax capful daily and could also use senna or senokot as needed.  May try steroid suppository as needed.  If not better with above, let us know for GI referral to discuss colonoscopy. 0  Constipation, Adult Constipation is when a person has fewer than three bowel movements in a week, has difficulty having a bowel movement, or has stools (feces) that are dry, hard, or larger than normal. Constipation may be caused by an underlying condition. It may become worse with age if a person takes certain medicines and does not take in enough fluids. Follow these instructions at home: Eating and drinking  Eat foods that have a lot of fiber, such as beans, whole grains, and fresh fruits and vegetables. Limit foods that are low in fiber and high in fat and processed sugars, such as fried or sweet foods. These include french fries, hamburgers, cookies, candies, and soda. Drink enough fluid to keep your urine pale yellow. General instructions Exercise regularly or as told by your health care provider. Try to do 150 minutes of moderate exercise each week. Use the bathroom when you have the urge to go. Do not hold it in. Take over-the-counter and prescription medicines only as told by your health care provider. This includes any fiber supplements. During bowel movements: Practice deep breathing while relaxing the lower abdomen. Practice pelvic floor relaxation. Watch your condition for any changes. Let your health care provider know about them. Keep all follow-up visits as told by your health care provider. This is important. Contact a health care provider if: You have pain that gets worse. You have a fever. You do not have a bowel movement after 4 days. You vomit. You are not hungry or you lose weight. You are bleeding from the opening between the buttocks (anus). You have thin, pencil-like stools. Get help right away if: You have a fever and your  symptoms suddenly get worse. You leak stool or have blood in your stool. Your abdomen is bloated. You have severe pain in your abdomen. You feel dizzy or you faint. Summary Constipation is when a person has fewer than three bowel movements in a week, has difficulty having a bowel movement, or has stools (feces) that are dry, hard, or larger than normal. Eat foods that have a lot of fiber, such as beans, whole grains, and fresh fruits and vegetables. Drink enough fluid to keep your urine pale yellow. Take over-the-counter and prescription medicines only as told by your health care provider. This includes any fiber supplements. This information is not intended to replace advice given to you by your health care provider. Make sure you discuss any questions you have with your health care provider. Document Revised: 01/03/2019 Document Reviewed: 01/03/2019 Elsevier Patient Education  2022 [employer].

## 2021-02-23 ENCOUNTER — Other Ambulatory Visit: Payer: Self-pay

## 2021-02-23 ENCOUNTER — Telehealth: Payer: 59 | Admitting: Nurse Practitioner

## 2021-02-23 ENCOUNTER — Encounter: Payer: Self-pay | Admitting: Emergency Medicine

## 2021-02-23 ENCOUNTER — Emergency Department
Admission: EM | Admit: 2021-02-23 | Discharge: 2021-02-24 | Disposition: A | Discharge status: Home / Self Care | Payer: 59 | Attending: Emergency Medicine | Admitting: Emergency Medicine

## 2021-02-23 DIAGNOSIS — J45909 Unspecified asthma, uncomplicated: Secondary | ICD-10-CM | POA: Diagnosis not present

## 2021-02-23 DIAGNOSIS — R109 Unspecified abdominal pain: Secondary | ICD-10-CM | POA: Diagnosis present

## 2021-02-23 DIAGNOSIS — N201 Calculus of ureter: Secondary | ICD-10-CM | POA: Diagnosis not present

## 2021-02-23 DIAGNOSIS — N23 Unspecified renal colic: Secondary | ICD-10-CM

## 2021-02-23 DIAGNOSIS — F17210 Nicotine dependence, cigarettes, uncomplicated: Secondary | ICD-10-CM | POA: Insufficient documentation

## 2021-02-23 DIAGNOSIS — R300 Dysuria: Secondary | ICD-10-CM

## 2021-02-23 LAB — CBC WITH DIFFERENTIAL/PLATELET
Abs Immature Granulocytes: 0.02 10*3/uL (ref 0.00–0.07)
Basophils Absolute: 0 10*3/uL (ref 0.0–0.1)
Basophils Relative: 0 %
Eosinophils Absolute: 0.5 10*3/uL (ref 0.0–0.5)
Eosinophils Relative: 9 %
HCT: 45.4 % (ref 39.0–52.0)
Hemoglobin: 15.2 g/dL (ref 13.0–17.0)
Immature Granulocytes: 0 %
Lymphocytes Relative: 28 %
Lymphs Abs: 1.5 10*3/uL (ref 0.7–4.0)
MCH: 30.7 pg (ref 26.0–34.0)
MCHC: 33.5 g/dL (ref 30.0–36.0)
MCV: 91.7 fL (ref 80.0–100.0)
Monocytes Absolute: 0.7 10*3/uL (ref 0.1–1.0)
Monocytes Relative: 13 %
Neutro Abs: 2.6 10*3/uL (ref 1.7–7.7)
Neutrophils Relative %: 50 %
Platelets: 238 10*3/uL (ref 150–400)
RBC: 4.95 MIL/uL (ref 4.22–5.81)
RDW: 13 % (ref 11.5–15.5)
WBC: 5.4 10*3/uL (ref 4.0–10.5)
nRBC: 0 % (ref 0.0–0.2)

## 2021-02-23 LAB — COMPREHENSIVE METABOLIC PANEL
ALT: 18 U/L (ref 0–44)
AST: 19 U/L (ref 15–41)
Albumin: 3.8 g/dL (ref 3.5–5.0)
Alkaline Phosphatase: 51 U/L (ref 38–126)
Anion gap: 7 (ref 5–15)
BUN: 18 mg/dL (ref 6–20)
CO2: 26 mmol/L (ref 22–32)
Calcium: 9.1 mg/dL (ref 8.9–10.3)
Chloride: 104 mmol/L (ref 98–111)
Creatinine, Ser: 1.09 mg/dL (ref 0.61–1.24)
GFR, Estimated: >60 mL/min (ref >60)
Glucose, Bld: 87 mg/dL (ref 70–99)
Potassium: 4.1 mmol/L (ref 3.5–5.1)
Sodium: 137 mmol/L (ref 135–145)
Total Bilirubin: 0.6 mg/dL (ref 0.3–1.2)
Total Protein: 6.8 g/dL (ref 6.5–8.1)

## 2021-02-23 LAB — URINALYSIS, ROUTINE W REFLEX MICROSCOPIC
Bilirubin Urine: NEGATIVE
Glucose, UA: NEGATIVE mg/dL
Ketones, ur: NEGATIVE mg/dL
Leukocytes,Ua: NEGATIVE
Nitrite: NEGATIVE
Protein, ur: NEGATIVE mg/dL
Specific Gravity, Urine: 1.02 (ref 1.005–1.030)
pH: 5.5 (ref 5.0–8.0)

## 2021-02-23 LAB — URINALYSIS, MICROSCOPIC (REFLEX)

## 2021-02-23 MED ORDER — OXYCODONE-ACETAMINOPHEN 5-325 MG PO TABS
1.0000 | ORAL_TABLET | Freq: Once | ORAL | Status: AC
  Administered 2021-02-23: 17:00:00 1 via ORAL
  Filled 2021-02-23: qty 1

## 2021-02-23 NOTE — Progress Notes (Signed)
Based on what you shared with me, I feel your condition warrants further evaluation and I recommend that you be seen in a face to face visit.  We cannot prescribe any narcotics or controlled substances within eVisits.   If you feel that you need these, a face to face with a provider is required.     If you are having a true medical emergency please call 911.     For an urgent face to face visit, [place] has six urgent care centers for your convenience:     [facility] at [facility] Directions [phone] [address] [place], [place] [address]    [facility] Health Urgent Care Center [facility]) Get Driving Directions [phone] [address] [place], [place] [address]  [facility] Health Urgent Care Center [facility]) Get Driving Directions [phone] [address] [place],  [place]  [address]  [facility] Health Urgent Care at [facility] Get Driving Directions [phone] [address] [place], [place] [address]   [facility] Health Urgent Care at [facility] Get Driving Directions  [phone] [address] [place], [place] [address]   [facility] Health Urgent Care at [facility] Directions [phone] [address] [place], [place] [address]  Your MyChart E-visit questionnaire answers were reviewed by a board certified advanced clinical practitioner to complete your personal care plan based on your specific symptoms.  Thank you for using e-Visits.      NOTE: There will be NO CHARGE for this eVisit   If you are having a true medical emergency please call 911.      For an urgent face to face visit, [place] has six urgent care centers for your convenience:     [facility] Health Urgent Care Center at [facility] Directions [phone] [address] [place], [place] [address]    [facility] Urgent Care Center [facility]) Get  Driving Directions [phone] [address] [place], [place] [address]  [facility] Health Urgent Care Center [facility]) Get Driving Directions [phone] [address] [place],  [place]  [address]  [facility] Health Urgent Care at [facility] Get Driving Directions [phone] [address] [place], [place] [address]   [facility] Health Urgent Care at [facility] Get Driving Directions  [phone] [address] [place], [place] [address]   [facility] Health Urgent Care at [facility] Directions [phone] [address] [place], [place] [address]  Your MyChart E-visit questionnaire answers were reviewed by a board certified advanced clinical practitioner to complete your personal care plan based on your specific symptoms.  Thank you for using e-Visits.

## 2021-02-23 NOTE — ED Triage Notes (Signed)
Pt to ED via POV with c/o flank pain since 12/18 and was diagnosised with a 5 mm kidney stone, he went to the ED in [place] and they sent him home with medication. He tried calling his PMD and states that he cant take the pain "something has to be done today."

## 2021-02-23 NOTE — ED Provider Notes (Signed)
Emergency Medicine Provider Triage Evaluation Note  Justin Webb , a 45 y.o. male  was evaluated in triage.  Pt complains of flank pain due to known kidney stone. He was evaluated at [facility] ER and had a CT that showed a 63mm stone. For the past 2 days, pain has been unbearable even with his medications. (Toradol).  Review of Systems  Positive: Right flank pain Negative: Vomiting  Physical Exam  BP 139/82 (BP Location: Left Arm)    Pulse 91    Temp 98.5 F (36.9 C) (Oral)    Resp 18    SpO2 96%  Gen:   Awake, no distress   Resp:  Normal effort  MSK:   Moves extremities without difficulty  Other:    Medical Decision Making  Medically screening exam initiated at 4:34 PM.  Appropriate orders placed.  [name] was informed that the remainder of the evaluation will be completed by another provider, this initial triage assessment does not replace that evaluation, and the importance of remaining in the ED until their evaluation is complete.    Chinita Pester, FNP 02/23/21 1638    Minna Antis, MD 02/23/21 2322

## 2021-02-24 ENCOUNTER — Ambulatory Visit: Payer: 59 | Admitting: Urology

## 2021-02-24 ENCOUNTER — Encounter: Payer: Self-pay | Admitting: Radiology

## 2021-02-24 ENCOUNTER — Emergency Department: Payer: 59

## 2021-02-24 ENCOUNTER — Other Ambulatory Visit: Payer: Self-pay | Admitting: Urology

## 2021-02-24 ENCOUNTER — Encounter: Payer: Self-pay | Admitting: Urology

## 2021-02-24 VITALS — BP 137/81 | HR 91 | Ht 73.0 in | Wt 217.0 lb

## 2021-02-24 DIAGNOSIS — N201 Calculus of ureter: Secondary | ICD-10-CM | POA: Diagnosis not present

## 2021-02-24 DIAGNOSIS — N23 Unspecified renal colic: Secondary | ICD-10-CM | POA: Diagnosis not present

## 2021-02-24 LAB — URINALYSIS, COMPLETE
Bilirubin, UA: NEGATIVE
Glucose, UA: NEGATIVE
Ketones, UA: NEGATIVE
Leukocytes,UA: NEGATIVE
Nitrite, UA: NEGATIVE
Protein,UA: NEGATIVE
Specific Gravity, UA: 1.02 (ref 1.005–1.030)
Urobilinogen, Ur: 0.2 mg/dL (ref 0.2–1.0)
pH, UA: 7 (ref 5.0–7.5)

## 2021-02-24 LAB — MICROSCOPIC EXAMINATION: Bacteria, UA: NONE SEEN

## 2021-02-24 MED ORDER — ONDANSETRON 4 MG PO TBDP: ORAL_TABLET | ORAL | 0 refills | Status: DC

## 2021-02-24 MED ORDER — ONDANSETRON HCL 4 MG/2ML IJ SOLN
4.0000 mg | INTRAMUSCULAR | Status: AC
  Administered 2021-02-24: 4 mg via INTRAVENOUS
  Filled 2021-02-24: qty 2

## 2021-02-24 MED ORDER — TAMSULOSIN HCL 0.4 MG PO CAPS: ORAL_CAPSULE | ORAL | 0 refills | Status: DC

## 2021-02-24 MED ORDER — SODIUM CHLORIDE 0.9 % IV BOLUS
500.0000 mL | Freq: Once | INTRAVENOUS | Status: AC
  Administered 2021-02-24: 500 mL via INTRAVENOUS

## 2021-02-24 MED ORDER — OXYCODONE-ACETAMINOPHEN 5-325 MG PO TABS: 2.0000 | ORAL_TABLET | Freq: Three times a day (TID) | ORAL | 0 refills | Status: DC | PRN

## 2021-02-24 MED ORDER — MORPHINE SULFATE (PF) 4 MG/ML IV SOLN
4.0000 mg | Freq: Once | INTRAVENOUS | Status: AC
  Administered 2021-02-24: 4 mg via INTRAVENOUS
  Filled 2021-02-24: qty 1

## 2021-02-24 MED ORDER — KETOROLAC TROMETHAMINE 30 MG/ML IJ SOLN
15.0000 mg | Freq: Once | INTRAMUSCULAR | Status: AC
  Administered 2021-02-24: 15 mg via INTRAVENOUS
  Filled 2021-02-24: qty 1

## 2021-02-24 MED ORDER — DOCUSATE SODIUM 100 MG PO CAPS: ORAL_CAPSULE | ORAL | 0 refills | Status: DC

## 2021-02-24 NOTE — ED Provider Notes (Signed)
[facility] Emergency Department Provider Note  ____________________________________________   Event Date/Time   First MD Initiated Contact with Patient 02/23/21 2357     (approximate)  I have reviewed the triage vital signs and the nursing notes.   HISTORY  Chief Complaint No chief complaint on file.    HPI Chanc Justin Webb is a 45 y.o. male with medical history as listed below which notably includes a prior history of kidney stones in 2013 and 2014 who presents for evaluation of persistent severe right flank pain x1 week.  The pain is sharp and comes and goes in waves.  He went to an emergency department in [place] about a week ago after the pain started when he was having nausea and vomiting as well.  He said they gave him a medicine that he believes was Toradol and he felt quite a bit better.  He was discharged with some prescriptions including oral Toradol and Cipro after he was diagnosed with a right ureteral stone which he was told was about 5 mm in width.  However he has continued to have severe pain that is keeping him from being able to conduct his daily activities.  He has had no additional vomiting but occasional nausea.  He says his urine is a brown color but it is not painful to urinate.  He denies fever, sore throat, chest pain or shortness of breath.  Nothing in particular makes his symptoms better or worse and they are severe.     Past Medical History:  Diagnosis Date   Anxiety    Childhood asthma    Ex-smoker quit 10/2012   Kidney stone 12/2011   R   Pancreatitis    Testicular torsion 1995   left    Patient Active Problem List   Diagnosis Date Noted   Constipation 11/17/2020   Right sided sciatica 05/08/2019   Health maintenance examination 05/25/2017   Neck pain on left side 12/25/2015   GAD (generalized anxiety disorder) 10/12/2012   Smoker 10/12/2012    Past Surgical History:  Procedure Laterality Date   exercise  treadmill  10/2012   WNL, excellent exercise tolerance   testicular torsion  1995   left    Prior to Admission medications   Medication Sig Start Date End Date Taking? Authorizing Provider  docusate sodium (COLACE) 100 MG capsule Take 1 tablet once or twice daily as needed for constipation while taking narcotic pain medicine 02/24/21  Yes Hinda Kehr, MD  ondansetron (ZOFRAN-ODT) 4 MG disintegrating tablet Allow 1-2 tablets to dissolve in your mouth every 8 hours as needed for nausea/vomiting 02/24/21  Yes Hinda Kehr, MD  oxyCODONE-acetaminophen (PERCOCET) 5-325 MG tablet Take 2 tablets by mouth every 8 (eight) hours as needed for severe pain. 02/24/21  Yes Hinda Kehr, MD  tamsulosin (FLOMAX) 0.4 MG CAPS capsule Take 1 tablet by mouth daily until you pass the kidney stone or no longer have symptoms 02/24/21  Yes Hinda Kehr, MD  busPIRone (BUSPAR) 5 MG tablet Take 1 tablet (5 mg total) by mouth daily. 02/15/20   Ria Bush, MD  gabapentin (NEURONTIN) 300 MG capsule Take 1 capsule (300 mg total) by mouth at bedtime. 02/15/20   Ria Bush, MD  hydrocortisone (ANUSOL-HC) 25 MG suppository Place 1 suppository (25 mg total) rectally 2 (two) times daily. 11/17/20   Ria Bush, MD  ibuprofen (ADVIL,MOTRIN) 200 MG tablet Take 200 mg by mouth every 6 (six) hours as needed for pain.    [provider]  LORazepam (ATIVAN) 0.5 MG tablet TAKE 1 TABLET BY MOUTH 2 TIMES DAILY AS NEEDED. 09/23/20   Ria Bush, MD  Melatonin 5 MG TABS Take 1 tablet by mouth at bedtime.    [provider]    Allergies Patient has no known allergies.  Family History  Problem Relation Age of Onset   Healthy Father    Heart attack Mother        slight; + smoker; Mid 82's   Diabetes Maternal Grandmother    Other Paternal Grandfather        Brain tumor    Social History Social History   Tobacco Use   Smoking status: Some Days    Packs/day: 0.25    Years: 10.00     Pack years: 2.50    Types: Cigarettes   Smokeless tobacco: Never  Substance Use Topics   Alcohol use: No    Alcohol/week: 0.0 standard drinks    Comment: Rare   Drug use: No    Review of Systems Constitutional: No fever/chills Eyes: No visual changes. ENT: No sore throat. Cardiovascular: Denies chest pain. Respiratory: Denies shortness of breath. Gastrointestinal: Right flank pain and right abdominal pain with some nausea and vomiting about a week ago but no persistent vomiting. Genitourinary: Brown-colored urine/hematuria. Musculoskeletal: Right flank pain. Integumentary: Negative for rash. Neurological: Negative for headaches, focal weakness or numbness.   ____________________________________________   PHYSICAL EXAM:  VITAL SIGNS: ED Triage Vitals  Enc Vitals Group     BP 02/23/21 1629 139/82     Pulse Rate 02/23/21 1629 91     Resp 02/23/21 1629 18     Temp 02/23/21 1629 98.5 F (36.9 C)     Temp Source 02/23/21 1629 Oral     SpO2 02/23/21 1629 96 %     Weight 02/23/21 1637 97.5 kg (215 lb)     Height 02/23/21 1637 1.854 m (6\' 1" )     Head Circumference --      Peak Flow --      Pain Score 02/23/21 1637 3     Pain Loc --      Pain Edu? --      Excl. in [place]? --     Constitutional: Alert and oriented.  Appears very uncomfortable and in pain. Eyes: Conjunctivae are normal.  Head: Atraumatic. Nose: No congestion/rhinnorhea. Mouth/Throat: Patient is wearing a mask. Neck: No stridor.  No meningeal signs.   Cardiovascular: Normal rate, regular rhythm. Good peripheral circulation. Respiratory: Normal respiratory effort.  No retractions. Gastrointestinal: Soft and nondistended.  Generalized tenderness to palpation of the right side of the abdomen which seems to be radiating from the right flank. Musculoskeletal: Right flank tenderness to percussion. Neurologic:  Normal speech and language. No gross focal neurologic deficits are appreciated.  Skin:  Skin is warm,  dry and intact. Psychiatric: Mood and affect are normal. Speech and behavior are normal.  ____________________________________________   LABS (all labs ordered are listed, but only abnormal results are displayed)  Labs Reviewed  URINALYSIS, ROUTINE W REFLEX MICROSCOPIC - Abnormal; Notable for the following components:      Result Value   Hgb urine dipstick SMALL (*)    All other components within normal limits  URINALYSIS, MICROSCOPIC (REFLEX) - Abnormal; Notable for the following components:   Bacteria, UA RARE (*)    All other components within normal limits  COMPREHENSIVE METABOLIC PANEL  CBC WITH DIFFERENTIAL/PLATELET   ____________________________________________   RADIOLOGY Ursula Alert, personally viewed  and evaluated these images (plain radiographs) as part of my medical decision making, as well as reviewing the written report by the radiologist.  ED MD interpretation: Right mid ureteral stone is present.  Official radiology report(s): DG Abd 2 Views  Result Date: 02/24/2021 CLINICAL DATA:  History of flank pain and known right ureteral stone EXAM: ABDOMEN - 2 VIEW COMPARISON:  None. FINDINGS: Scattered large and small bowel gas is noted. A 5 mm calcification is noted just to the right of the midline at the level of the L3 transverse process consistent with right ureteral stone. No renal calculi are seen. No distal ureteral stones are noted. IMPRESSION: Stable right mid ureteral stone. Electronically Signed   By: Alcide Clever M.D.   On: 02/24/2021 01:36    ____________________________________________   PROCEDURES   Procedure(s) performed (including Critical Care):  Procedures   ____________________________________________   INITIAL IMPRESSION / MDM / ASSESSMENT AND PLAN / ED COURSE  As part of my medical decision making, I reviewed the following data within the electronic MEDICAL RECORD NUMBER Nursing notes reviewed and incorporated, Labs reviewed , Old chart  reviewed, Radiograph reviewed , Notes from prior ED visits, and [place] Controlled Substance Database   Differential diagnosis includes, but is not limited to, persistent ureteral stone with renal colic, infected stone, UTI/pyonephritis.  Vital signs are stable, no signs of infection.  Urinalysis shows some hemoglobinuria but no evidence of infection and the patient is taking Cipro prescribed at the outpatient ED.  Comprehensive metabolic panel is normal and CBC is normal.  Not concern for sepsis or infected stone at this time.  I reviewed the discharge paperwork he was provided but I cannot find a record of the visit in the system; it seems that the emergency department in [place] is on a different EMR.  He was prescribed oral Toradol and Cipro but no opioids and his pain is not adequately controlled.  I had my usual and customary kidney/ureteral stone discussion with the patient and we are going to proceed with morphine 4 mg IV, Toradol 15 mg IV, and Zofran 4 mg IV as well as 500 mL of normal saline bolus.  I will obtain x-rays rather than another CT scan to see if we can visualize a stone.  Patient understands that his best option is to follow-up at [facility] for possible lithotripsy on Thursday.  I will reassess after his pain is better controlled.       Clinical Course as of 02/24/21 7673  Tue Feb 24, 2021  0108 Patient gave me the thumbs up and reports that his pain is much better.  Awaiting x-ray results. [CF]  0141 DG Abd 2 Views I personally reviewed the patient's imaging and agree with the radiologist's interpretation that there is a right ureteral stone in the with no other acute abnormalities identified. [CF]  0205 Patient is comfortable at this point and ready for discharge.  I went over all my recommendations again, including avoiding all NSAIDs (including the ketorolac he was recently prescribed) as of now, at least until he finds out if he is eligible for  lithotripsy on Thursday.  I am sending a message through [facility] to Dr. Apolinar Junes to let her know of the patient to help facilitate close outpatient follow-up.  I gave my usual return precautions. [CF]    Clinical Course User Index [CF] Loleta Rose, MD     ____________________________________________  FINAL CLINICAL IMPRESSION(S) / ED DIAGNOSES  Final diagnoses:  Ureterolithiasis  Renal colic on right side     MEDICATIONS GIVEN DURING THIS VISIT:  Medications  oxyCODONE-acetaminophen (PERCOCET/ROXICET) 5-325 MG per tablet 1 tablet (1 tablet Oral Given 02/23/21 1641)  morphine 4 MG/ML injection 4 mg (4 mg Intravenous Given 02/24/21 0027)  ondansetron (ZOFRAN) injection 4 mg (4 mg Intravenous Given 02/24/21 0027)  ketorolac (TORADOL) 30 MG/ML injection 15 mg (15 mg Intravenous Given 02/24/21 0028)  sodium chloride 0.9 % bolus 500 mL (0 mLs Intravenous Stopped 02/24/21 0053)     ED Discharge Orders          Ordered    oxyCODONE-acetaminophen (PERCOCET) 5-325 MG tablet  Every 8 hours PRN        02/24/21 0123    tamsulosin (FLOMAX) 0.4 MG CAPS capsule        02/24/21 0123    docusate sodium (COLACE) 100 MG capsule        02/24/21 0123    ondansetron (ZOFRAN-ODT) 4 MG disintegrating tablet        02/24/21 0123             Note:  This document was prepared using Dragon voice recognition software and may include unintentional dictation errors.   Hinda Kehr, MD 02/24/21 [phone]

## 2021-02-24 NOTE — Discharge Instructions (Addendum)
You have been seen in the Emergency Department (ED) today for pain caused by kidney stones.  As we have discussed, please drink plenty of fluids.  Please make a follow up appointment with the physician(s) listed elsewhere in this documentation.  You may take pain medication as needed but ONLY as prescribed.  Please also take your prescribed Flomax daily.  If you are going to follow up with Urology for possible lithotripsy, please do not take any ibuprofen, naproxen, aspirin, Toradol (ketorolac), or other NSAID after Tuesday morning, as this may exclude you from lithotripsy.  Please see your doctor as soon as possible as stones may take 1-3 weeks to pass and you may require additional care or medications.  Do not drink alcohol, drive or participate in any other potentially dangerous activities while taking opiate pain medication as it may make you sleepy. Do not take this medication with any other sedating medications, either prescription or over-the-counter. If you were prescribed Percocet or Vicodin, do not take these with acetaminophen (Tylenol) as it is already contained within these medications.   Take Percocet as needed for severe pain.  This medication is an opiate (or narcotic) pain medication and can be habit forming.  Use it as little as possible to achieve adequate pain control.  Do not use or use it with extreme caution if you have a history of opiate abuse or dependence.  If you are on a pain contract with your primary care doctor or a pain specialist, be sure to let them know you were prescribed this medication today from the [facility] Emergency Department.  This medication is intended for your use only - do not give any to anyone else and keep it in a secure place where nobody else, especially children, have access to it.  It will also cause or worsen constipation, so you may want to consider taking an over-the-counter stool softener while you are taking this medication.  Return to  the Emergency Department (ED) or call your doctor if you have any worsening pain, fever, painful urination, are unable to urinate, or develop other symptoms that concern you.

## 2021-02-24 NOTE — Progress Notes (Signed)
[facility] ORDER FORM  Expected date of procedure: 12/29  Surgeon: John Giovanni, MD  Post op standing: 2-4wk follow up w/KUB prior  Anticoagulation/Aspirin/NSAID standing order: N/A-stone below kidney  Anesthesia standing order: MAC  VTE standing: SCD's  Dx: Right Ureteral Stone  Procedure: right Extracorporeal shock wave lithotripsy  CPT : 91660  Standing Order Set:   *NPO after mn, KUB  *NS 17m/hr, Keflex 5068mPO, Benadryl 2536mO, Valium 77m15m, Zofran 4mg 31m   Medications if other than standing orders:    n/a

## 2021-02-24 NOTE — ED Notes (Signed)
Pt ambulatory to restroom, tolerated well. States he feels better.

## 2021-02-24 NOTE — H&P (View-Only) (Signed)
02/24/2021 4:54 PM   Justin Webb 1975/07/06 [id number]  Referring provider: Eustaquio Boyden, MD [address] [place],  [place] [address]  Chief Complaint  Patient presents with   Nephrolithiasis    HPI: Justin Webb is a 45 y.o. male who presents in follow-up of a recent ED visit for renal colic.  Seen in ED [place], [place] 02/15/2021 with severe right flank pain radiating to the right lower quadrant and groin region Records are not available for review however patient states he had a CT and was told he had a right renal calculus causing his symptoms. 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 If you have any questions, ask your nurse or doctor.          STOP taking these medications    ondansetron 4 MG disintegrating tablet Commonly known as: ZOFRAN-ODT Stopped by: Riki Altes, MD       TAKE these medications    busPIRone 5 MG tablet Commonly known as: BUSPAR Take 1 tablet (5 mg total) by mouth daily.   ciprofloxacin 500 MG tablet Commonly known as: CIPRO SMARTSIG:1 Tablet(s) By Mouth Every 12 Hours   docusate  sodium 100 MG capsule Commonly known as: Colace Take 1 tablet once or twice daily as needed for constipation while taking narcotic pain medicine   gabapentin 300 MG capsule Commonly known as: NEURONTIN Take 1 capsule (300 mg total) by mouth at bedtime.   hydrocortisone 25 MG suppository Commonly known as: ANUSOL-HC Place 1 suppository (25 mg total) rectally 2 (two) times daily.   ibuprofen 200 MG tablet Commonly known as: ADVIL Take 200 mg by mouth every 6 (six) hours as needed for pain.   LORazepam 0.5 MG tablet Commonly known as: ATIVAN TAKE 1 TABLET BY MOUTH 2 TIMES DAILY AS NEEDED.   melatonin 5 MG Tabs Take 1 tablet by mouth at bedtime.   ondansetron 4 MG tablet Commonly known as: ZOFRAN Take 4 mg by mouth every 6 (six) hours as needed.   oxyCODONE-acetaminophen 5-325 MG tablet Commonly known as: Percocet Take 2 tablets by mouth every 8 (eight) hours as needed for severe pain.   tamsulosin 0.4 MG Caps capsule Commonly known as: FLOMAX Take 1 tablet by mouth daily until you pass the kidney stone or no longer have symptoms        Allergies: No Known Allergies  Family History: Family History  Problem Relation Age of Onset   Healthy Father    Heart attack Mother  slight; + smoker; Mid 40's   Diabetes Maternal Grandmother    Other Paternal Grandfather        Brain tumor    Social History:  reports that he has been smoking cigarettes. He has a 2.50 pack-year smoking history. He has never used smokeless tobacco. He reports that he does not drink alcohol and does not use drugs.   Physical Exam: BP 137/81    Pulse 91    Ht 6\' 1"  (1.854 m)    Wt 217 lb (98.4 kg)    BMI 28.63 kg/m   Constitutional:  Alert and oriented, No acute distress. HEENT: [place] AT, moist mucus membranes.  Trachea midline, no masses. Cardiovascular: No clubbing, cyanosis, or edema. Respiratory: Normal respiratory effort, no increased work of breathing. Skin: No rashes, bruises or  suspicious lesions. Neurologic: Grossly intact, no focal deficits, moving all 4 extremities. Psychiatric: Normal mood and affect.  Laboratory Data:  Urinalysis Appearance yellow, clear Dipstick 2+ blood Microscopy 11-30 RBC/negative WBC   Pertinent Imaging: KUB images were personally reviewed and interpreted.  5 mm calcification 2 cm inferior to the lower border of the right renal outline   Assessment & Plan:    1.  Right ureteral calculus We discussed various treatment options for urolithiasis including observation with or without medical expulsive therapy, shockwave lithotripsy (SWL), ureteroscopy and laser lithotripsy with stent placement.  We discussed that management is based on stone size, location, density, patient co-morbidities, and patient preference.   Stones <64mm in size have a >80% spontaneous passage rate. Data surrounding the use of tamsulosin for medical expulsive therapy is controversial, but meta analyses suggests it is most efficacious for distal stones between 5-56mm in size. Possible side effects include dizziness/lightheadedness, and retrograde ejaculation.  SWL has a lower stone free rate in a single procedure, but also a lower complication rate compared to ureteroscopy and avoids a stent and associated stent related symptoms. Possible complications include renal hematoma, steinstrasse, and need for additional treatment.  Ureteroscopy with laser lithotripsy and stent placement has a higher stone free rate than SWL in a single procedure, however increased complication rate including possible infection, ureteral injury, bleeding, and stent related morbidity. Common stent related symptoms include dysuria, urgency/frequency, and flank pain.  After an extensive discussion of the risks and benefits of the above treatment options, the patient would like to proceed with SWL this week.  Records from the [facility] ED in [place] have been requested for review   [place], MD  [facility] [address] [place], [place] [phone]

## 2021-02-24 NOTE — Progress Notes (Signed)
02/24/2021 4:54 PM   Justin Webb 1976-01-02 [id number]  Referring provider: Eustaquio Boyden, MD [address] [place],  [place] [address]  Chief Complaint  Patient presents with   Nephrolithiasis    HPI: Justin Webb is a 45 y.o. male who presents in follow-up of a recent ED visit for renal colic.  Seen in ED [place], [place] 02/15/2021 with severe right flank pain radiating to the right lower quadrant and groin region Records are not available for review however patient states he had a CT and was told he had a right renal calculus causing his symptoms. Initially had nausea and vomiting which has resolved Continued with intermittent pain which increased in severity necessitating 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 If you have any questions, ask your nurse or doctor.          STOP taking these medications    ondansetron 4 MG disintegrating tablet Commonly known as: ZOFRAN-ODT Stopped by: Riki Altes, MD       TAKE these medications    busPIRone 5 MG tablet Commonly known as: BUSPAR Take 1 tablet (5 mg total) by mouth daily.   ciprofloxacin 500 MG tablet Commonly known as: CIPRO SMARTSIG:1 Tablet(s) By Mouth Every 12 Hours   docusate  sodium 100 MG capsule Commonly known as: Colace Take 1 tablet once or twice daily as needed for constipation while taking narcotic pain medicine   gabapentin 300 MG capsule Commonly known as: NEURONTIN Take 1 capsule (300 mg total) by mouth at bedtime.   hydrocortisone 25 MG suppository Commonly known as: ANUSOL-HC Place 1 suppository (25 mg total) rectally 2 (two) times daily.   ibuprofen 200 MG tablet Commonly known as: ADVIL Take 200 mg by mouth every 6 (six) hours as needed for pain.   LORazepam 0.5 MG tablet Commonly known as: ATIVAN TAKE 1 TABLET BY MOUTH 2 TIMES DAILY AS NEEDED.   melatonin 5 MG Tabs Take 1 tablet by mouth at bedtime.   ondansetron 4 MG tablet Commonly known as: ZOFRAN Take 4 mg by mouth every 6 (six) hours as needed.   oxyCODONE-acetaminophen 5-325 MG tablet Commonly known as: Percocet Take 2 tablets by mouth every 8 (eight) hours as needed for severe pain.   tamsulosin 0.4 MG Caps capsule Commonly known as: FLOMAX Take 1 tablet by mouth daily until you pass the kidney stone or no longer have symptoms        Allergies: No Known Allergies  Family History: Family History  Problem Relation Age of Onset   Healthy Father    Heart attack Mother  slight; + smoker; Mid 40's   Diabetes Maternal Grandmother    Other Paternal Grandfather        Brain tumor    Social History:  reports that he has been smoking cigarettes. He has a 2.50 pack-year smoking history. He has never used smokeless tobacco. He reports that he does not drink alcohol and does not use drugs.   Physical Exam: BP 137/81    Pulse 91    Ht 6\' 1"  (1.854 m)    Wt 217 lb (98.4 kg)    BMI 28.63 kg/m   Constitutional:  Alert and oriented, No acute distress. HEENT: [place] AT, moist mucus membranes.  Trachea midline, no masses. Cardiovascular: No clubbing, cyanosis, or edema. Respiratory: Normal respiratory effort, no increased work of breathing. Skin: No rashes, bruises or  suspicious lesions. Neurologic: Grossly intact, no focal deficits, moving all 4 extremities. Psychiatric: Normal mood and affect.  Laboratory Data:  Urinalysis Appearance yellow, clear Dipstick 2+ blood Microscopy 11-30 RBC/negative WBC   Pertinent Imaging: KUB images were personally reviewed and interpreted.  5 mm calcification 2 cm inferior to the lower border of the right renal outline   Assessment & Plan:    1.  Right ureteral calculus We discussed various treatment options for urolithiasis including observation with or without medical expulsive therapy, shockwave lithotripsy (SWL), ureteroscopy and laser lithotripsy with stent placement.  We discussed that management is based on stone size, location, density, patient co-morbidities, and patient preference.   Stones <64mm in size have a >80% spontaneous passage rate. Data surrounding the use of tamsulosin for medical expulsive therapy is controversial, but meta analyses suggests it is most efficacious for distal stones between 5-56mm in size. Possible side effects include dizziness/lightheadedness, and retrograde ejaculation.  SWL has a lower stone free rate in a single procedure, but also a lower complication rate compared to ureteroscopy and avoids a stent and associated stent related symptoms. Possible complications include renal hematoma, steinstrasse, and need for additional treatment.  Ureteroscopy with laser lithotripsy and stent placement has a higher stone free rate than SWL in a single procedure, however increased complication rate including possible infection, ureteral injury, bleeding, and stent related morbidity. Common stent related symptoms include dysuria, urgency/frequency, and flank pain.  After an extensive discussion of the risks and benefits of the above treatment options, the patient would like to proceed with SWL this week.  Records from the [facility] ED in [place] have been requested for review   [place], MD  [facility] [address] [place], [place] [phone]

## 2021-02-26 ENCOUNTER — Encounter: Payer: Self-pay | Admitting: Urology

## 2021-02-26 ENCOUNTER — Ambulatory Visit
Admission: RE | Admit: 2021-02-26 | Discharge: 2021-02-26 | Disposition: A | Discharge status: Home / Self Care | Payer: 59 | Attending: Urology | Admitting: Urology

## 2021-02-26 ENCOUNTER — Encounter
Admission: RE | Disposition: A | Discharge status: Home / Self Care | Payer: Self-pay | Source: Home / Self Care | Attending: Urology

## 2021-02-26 ENCOUNTER — Ambulatory Visit: Payer: 59

## 2021-02-26 DIAGNOSIS — N200 Calculus of kidney: Secondary | ICD-10-CM | POA: Diagnosis present

## 2021-02-26 DIAGNOSIS — N201 Calculus of ureter: Secondary | ICD-10-CM | POA: Diagnosis not present

## 2021-02-26 DIAGNOSIS — F17210 Nicotine dependence, cigarettes, uncomplicated: Secondary | ICD-10-CM | POA: Diagnosis not present

## 2021-02-26 HISTORY — PX: EXTRACORPOREAL SHOCK WAVE LITHOTRIPSY: SHX1557

## 2021-02-26 SURGERY — LITHOTRIPSY, ESWL
Anesthesia: Moderate Sedation | Laterality: Right

## 2021-02-26 MED ORDER — ONDANSETRON HCL 4 MG/2ML IJ SOLN: 4.0000 mg | Freq: Once | INTRAMUSCULAR | Status: AC

## 2021-02-26 MED ORDER — CEPHALEXIN 500 MG PO CAPS
ORAL_CAPSULE | ORAL | Status: AC
  Administered 2021-02-26: 08:00:00 500 mg via ORAL
  Filled 2021-02-26: qty 1

## 2021-02-26 MED ORDER — CEPHALEXIN 500 MG PO CAPS: 500.0000 mg | ORAL_CAPSULE | Freq: Once | ORAL | Status: AC

## 2021-02-26 MED ORDER — DIPHENHYDRAMINE HCL 25 MG PO CAPS
ORAL_CAPSULE | ORAL | Status: AC
  Administered 2021-02-26: 08:00:00 25 mg via ORAL
  Filled 2021-02-26: qty 1

## 2021-02-26 MED ORDER — DIPHENHYDRAMINE HCL 25 MG PO CAPS: 25.0000 mg | ORAL_CAPSULE | ORAL | Status: AC

## 2021-02-26 MED ORDER — DIAZEPAM 5 MG PO TABS: 10.0000 mg | ORAL_TABLET | ORAL | Status: AC

## 2021-02-26 MED ORDER — ONDANSETRON HCL 4 MG/2ML IJ SOLN
INTRAMUSCULAR | Status: AC
  Administered 2021-02-26: 08:00:00 4 mg via INTRAVENOUS
  Filled 2021-02-26: qty 2

## 2021-02-26 MED ORDER — DIAZEPAM 5 MG PO TABS
ORAL_TABLET | ORAL | Status: AC
  Administered 2021-02-26: 08:00:00 10 mg via ORAL
  Filled 2021-02-26: qty 2

## 2021-02-26 MED ORDER — SODIUM CHLORIDE 0.9 % IV SOLN: INTRAVENOUS | Status: DC

## 2021-02-26 NOTE — Discharge Instructions (Addendum)
As per the [facility] discharge instructions Continue pain medication as needed Tamsulosin will help you pass stone fragments Follow-up appointment scheduled on/19/23  AMBULATORY SURGERY  DISCHARGE INSTRUCTIONS   The drugs that you were given will stay in your system until tomorrow so for the next 24 hours you should not:  Drive an automobile Make any legal decisions Drink any alcoholic beverage   You may resume regular meals tomorrow.  Today it is better to start with liquids and gradually work up to solid foods.  You may eat anything you prefer, but it is better to start with liquids, then soup and crackers, and gradually work up to solid foods.   Please notify your doctor immediately if you have any unusual bleeding, trouble breathing, redness and pain at the surgery site, drainage, fever, or pain not relieved by medication.    Additional Instructions:     Please contact your physician with any problems or Same Day Surgery at [phone], Monday through Friday 6 am to 4 pm, or [place] at [facility] number at [phone].

## 2021-02-26 NOTE — Interval H&P Note (Signed)
History and Physical Interval Note:  02/26/2021 9:03 AM  Justin Webb  has presented today for surgery, with the diagnosis of right stone.  The various methods of treatment have been discussed with the patient and family. After consideration of risks, benefits and other options for treatment, the patient has consented to  Procedure(s): EXTRACORPOREAL SHOCK WAVE LITHOTRIPSY (ESWL) (Right) as a surgical intervention.  The patient's history has been reviewed, patient examined, no change in status, stable for surgery.  I have reviewed the patient's chart and labs.  Questions were answered to the patient's satisfaction.     Dalexa Gentz C Estanislao Harmon

## 2021-02-28 LAB — CULTURE, URINE COMPREHENSIVE

## 2021-03-05 ENCOUNTER — Other Ambulatory Visit: Payer: Self-pay

## 2021-03-05 DIAGNOSIS — N201 Calculus of ureter: Secondary | ICD-10-CM

## 2021-03-18 NOTE — Progress Notes (Signed)
03/19/2021 8:04 PM   Justin Webb [birth year] [id number]  Referring provider: Ria Bush, MD [address] [place],  [place] [address]  Chief Complaint  Patient presents with   Nephrolithiasis   Urological history: 1.  Nephrolithiasis  2. Testicular torsion -Left orchiectomy 1995  HPI: Justin Webb is a 46 y.o. who is status post ESWL who presents today for follow up.  Underwent ESWL on 02/26/2021 for 5 mm right ureteral stone with Dr. Bernardo Heater.  Their postprocedural course was as expected and uneventful.     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 90 tablet 3   gabapentin (NEURONTIN) 300 MG capsule Take 1 capsule (300 mg total) by mouth at bedtime. 90 capsule 3   ibuprofen (ADVIL,MOTRIN) 200 MG tablet Take 200 mg by mouth every 6 (six) hours as needed for pain.     LORazepam (ATIVAN) 0.5 MG tablet TAKE 1 TABLET BY MOUTH 2 TIMES DAILY AS NEEDED. 30 tablet 0   Melatonin 5 MG TABS Take 1 tablet by mouth at bedtime.     ciprofloxacin (CIPRO) 500 MG tablet SMARTSIG:1 Tablet(s) By Mouth Every 12 Hours     docusate sodium (COLACE) 100 MG capsule Take 1 tablet once or twice daily as needed  for constipation while taking narcotic pain medicine (Patient not taking: Reported on 03/19/2021) 30 capsule 0   hydrocortisone (ANUSOL-HC) 25 MG suppository Place 1 suppository (25 mg total) rectally 2 (two) times daily. (Patient not taking: Reported on 03/19/2021) 12 suppository 0   ondansetron (ZOFRAN) 4 MG tablet Take 4 mg by mouth every 6 (six) hours as needed. (Patient not taking: Reported on 03/19/2021)     oxyCODONE-acetaminophen (PERCOCET) 5-325 MG tablet Take 2 tablets by mouth every 8 (eight) hours as needed for severe pain. (Patient not taking: Reported on 03/19/2021) 24 tablet 0   tamsulosin (FLOMAX) 0.4 MG CAPS capsule Take 1 tablet by mouth daily until you pass the kidney stone or no longer have symptoms (Patient not taking: Reported on 03/19/2021) 14 capsule 0   No current facility-administered medications on file prior to visit.    Allergies: No Known Allergies  Family History: Family History  Problem Relation Age of Onset   Healthy Father    Heart attack Mother        slight; + smoker; Mid 46's   Diabetes Maternal Grandmother    Other Paternal Grandfather        Brain tumor    Social History:  reports that he has been smoking cigarettes. He has a 2.50 pack-year smoking history. He has never used smokeless tobacco. He reports that he does not  drink alcohol and does not use drugs.  ROS: Pertinent ROS in HPI  Physical Exam: BP 110/75    Pulse 79    Ht 6\' 1"  (1.854 m)    Wt 214 lb (97.1 kg)    BMI 28.23 kg/m   Constitutional:  Well nourished. Alert and oriented, No acute distress. HEENT: [place] AT, mask in place.   Trachea midline. Cardiovascular: No clubbing, cyanosis, or edema. Respiratory: Normal respiratory effort, no increased work of breathing. Neurologic: Grossly intact, no focal deficits, moving all 4 extremities. Psychiatric: Normal mood and affect.  Laboratory Data: Lab Results  Component Value Date   WBC 5.4 02/23/2021   HGB 15.2 02/23/2021   HCT 45.4  02/23/2021   MCV 91.7 02/23/2021   PLT 238 02/23/2021    Lab Results  Component Value Date   CREATININE 1.09 02/23/2021    Urinalysis Results for orders placed or performed in visit on 03/19/21  Microscopic Examination   Urine  Result Value Ref Range   WBC, UA None seen 0 - 5 /hpf   RBC None seen 0 - 2 /hpf   Epithelial Cells (non renal) None seen 0 - 10 /hpf   Bacteria, UA None seen None seen/Few  Urinalysis, Complete  Result Value Ref Range   Specific Gravity, UA 1.015 1.005 - 1.030   pH, UA 6.0 5.0 - 7.5   Color, UA Yellow Yellow   Appearance Ur Clear Clear   Leukocytes,UA Negative Negative   Protein,UA Negative Negative/Trace   Glucose, UA Negative Negative   Ketones, UA Negative Negative   RBC, UA Negative Negative   Bilirubin, UA Negative Negative   Urobilinogen, Ur 0.2 0.2 - 1.0 mg/dL   Nitrite, UA Negative Negative   Microscopic Examination See below:   I have reviewed the labs.   Pertinent Imaging: CLINICAL DATA:  History of right ureteral stone with recent lithotripsy, initial encounter   EXAM: ABDOMEN - 1 VIEW   COMPARISON:  02/26/2021   FINDINGS: Scattered large and small bowel gas is noted. The previously seen right ureteral stone is no longer identified. No Steinstrasse is seen. No definitive renal calculi are noted. Nonobstructing bowel gas pattern is seen. No bony abnormality is noted.   IMPRESSION: No definitive renal or ureteral stones are noted.     Electronically Signed   By: Inez Catalina M.D.   On: 03/19/2021 19:30 I have independently reviewed the films.    Assessment & Plan:    1. Nephrolithiasis -no distinct stone is seen on today's KUB -patient still has some right sided discomfort   2. Microscopic hematuria -resolved  Return in about 1 month (around 04/19/2021) for KUB and symptom recheck .  These notes generated with voice recognition software. I apologize for typographical errors.  Zara Council, PA-C  [facility] [address]  [place], [place] [address] [phone]

## 2021-03-19 ENCOUNTER — Ambulatory Visit
Admission: RE | Admit: 2021-03-19 | Discharge: 2021-03-19 | Disposition: A | Discharge status: Home / Self Care | Payer: 59 | Source: Ambulatory Visit | Attending: Urology | Admitting: Urology

## 2021-03-19 ENCOUNTER — Other Ambulatory Visit: Payer: Self-pay

## 2021-03-19 ENCOUNTER — Ambulatory Visit (INDEPENDENT_AMBULATORY_CARE_PROVIDER_SITE_OTHER): Payer: 59 | Admitting: Urology

## 2021-03-19 ENCOUNTER — Encounter: Payer: Self-pay | Admitting: Urology

## 2021-03-19 ENCOUNTER — Telehealth: Payer: Self-pay | Admitting: Family Medicine

## 2021-03-19 VITALS — BP 110/75 | HR 79 | Ht 73.0 in | Wt 214.0 lb

## 2021-03-19 DIAGNOSIS — N201 Calculus of ureter: Secondary | ICD-10-CM | POA: Diagnosis present

## 2021-03-19 DIAGNOSIS — N200 Calculus of kidney: Secondary | ICD-10-CM

## 2021-03-19 DIAGNOSIS — R3129 Other microscopic hematuria: Secondary | ICD-10-CM

## 2021-03-19 LAB — URINALYSIS, COMPLETE
Bilirubin, UA: NEGATIVE
Glucose, UA: NEGATIVE
Ketones, UA: NEGATIVE
Leukocytes,UA: NEGATIVE
Nitrite, UA: NEGATIVE
Protein,UA: NEGATIVE
RBC, UA: NEGATIVE
Specific Gravity, UA: 1.015 (ref 1.005–1.030)
Urobilinogen, Ur: 0.2 mg/dL (ref 0.2–1.0)
pH, UA: 6 (ref 5.0–7.5)

## 2021-03-19 LAB — MICROSCOPIC EXAMINATION
Bacteria, UA: NONE SEEN
Epithelial Cells (non renal): NONE SEEN /hpf (ref 0–10)
RBC, Urine: NONE SEEN /hpf (ref 0–2)
WBC, UA: NONE SEEN /hpf (ref 0–5)

## 2021-03-19 NOTE — Telephone Encounter (Signed)
Spoke with Denton Lank, [place] requesting CT report.  She provided fax # [phone] to fax MR request.  Faxed request.

## 2021-03-19 NOTE — Telephone Encounter (Signed)
Justin Webb called in and stated that he had a ct abdomin and pelvis 02/15/21 and the recommendations are follow up ct scan in a yr due to the finding of nodules. And he had it done at [facility] in [place].

## 2021-03-23 ENCOUNTER — Ambulatory Visit: Payer: 59 | Admitting: Family Medicine

## 2021-03-23 ENCOUNTER — Other Ambulatory Visit: Payer: Self-pay

## 2021-03-23 ENCOUNTER — Encounter: Payer: Self-pay | Admitting: Family Medicine

## 2021-03-23 VITALS — BP 122/68 | HR 86 | Temp 98.0°F | Ht 73.0 in | Wt 219.1 lb

## 2021-03-23 DIAGNOSIS — R918 Other nonspecific abnormal finding of lung field: Secondary | ICD-10-CM | POA: Insufficient documentation

## 2021-03-23 DIAGNOSIS — Z23 Encounter for immunization: Secondary | ICD-10-CM | POA: Diagnosis not present

## 2021-03-23 DIAGNOSIS — N200 Calculus of kidney: Secondary | ICD-10-CM | POA: Insufficient documentation

## 2021-03-23 DIAGNOSIS — Z87891 Personal history of nicotine dependence: Secondary | ICD-10-CM | POA: Diagnosis not present

## 2021-03-23 NOTE — Assessment & Plan Note (Addendum)
Incidentally discovered pulmonary nodules on recent non contrasted CT abdomen and pelvis. Nodules are small - which is reassuring, however in smoking history did discuss checking non-contrasted CT chest to fully evaluate lungs. Pt agrees with plan.

## 2021-03-23 NOTE — Assessment & Plan Note (Signed)
Congratulated on ongoing abstinence.  

## 2021-03-23 NOTE — Progress Notes (Signed)
Patient ID: Justin Webb, male    DOB: 09/30/1975, 46 y.o.   MRN: [id number]  This visit was conducted in person.  BP 122/68    Pulse 86    Temp 98 F (36.7 C) (Temporal)    Ht 6\' 1"  (1.854 m)    Wt 219 lb 1 oz (99.4 kg)    SpO2 97%    BMI 28.90 kg/m    CC: discuss recent imaging findings Subjective:   HPI: Justin SalenWilliam Zuno Webb is a 46 y.o. male presenting on 03/23/2021 for Results (Wants to discuss nodules found during recent CT abd/pelvis. )   Recent ER visit to [facility] [place] on 02/15/2021 for R ureter stone confirmed by non-contrasted CT abd/pelvis, records reviewed. WBC 13.1, Hgb 15.2, plt 242, Cr 1.21, UA with blood. Treated with cipro antibiotic, NSAID and flomax. Subsequently [facility] at [facility] ER then by urology s/p extracorporeal shock wave lithotripsy - planned return in 1 month for rpt imaging and symptom check.   CT also incidentally found small bilateral pulmonary nodules measuring 3-4 mm maximally.   Denies fevers/chills, cough, dyspnea. No h/o pneumonia or lung infection.  Ex smoker - started age 46yo, <20 PY hx, fully quit last year. No vaping. No fmhx lung cancer. No asbestos, or other known chemical exposure.  No known fungal exposure, cave diving, travel to [place].      Relevant past medical, surgical, family and social history reviewed and updated as indicated. Interim medical history since our last visit reviewed. Allergies and medications reviewed and updated. Outpatient Medications Prior to Visit  Medication Sig Dispense Refill   busPIRone (BUSPAR) 5 MG tablet Take 1 tablet (5 mg total) by mouth daily. 90 tablet 3   gabapentin (NEURONTIN) 300 MG capsule Take 1 capsule (300 mg total) by mouth at bedtime. 90 capsule 3   ibuprofen (ADVIL,MOTRIN) 200 MG tablet Take 200 mg by mouth every 6 (six) hours as needed for pain.     LORazepam (ATIVAN) 0.5 MG tablet TAKE 1 TABLET BY MOUTH 2 TIMES DAILY AS NEEDED. 30 tablet 0   Melatonin 5 MG TABS Take 1  tablet by mouth at bedtime.     docusate sodium (COLACE) 100 MG capsule Take 1 tablet once or twice daily as needed for constipation while taking narcotic pain medicine (Patient not taking: Reported on 03/19/2021) 30 capsule 0   hydrocortisone (ANUSOL-HC) 25 MG suppository Place 1 suppository (25 mg total) rectally 2 (two) times daily. (Patient not taking: Reported on 03/19/2021) 12 suppository 0   ondansetron (ZOFRAN) 4 MG tablet Take 4 mg by mouth every 6 (six) hours as needed. (Patient not taking: Reported on 03/19/2021)     oxyCODONE-acetaminophen (PERCOCET) 5-325 MG tablet Take 2 tablets by mouth every 8 (eight) hours as needed for severe pain. (Patient not taking: Reported on 03/19/2021) 24 tablet 0   tamsulosin (FLOMAX) 0.4 MG CAPS capsule Take 1 tablet by mouth daily until you pass the kidney stone or no longer have symptoms (Patient not taking: Reported on 03/19/2021) 14 capsule 0   No facility-administered medications prior to visit.     Per HPI unless specifically indicated in ROS section below Review of Systems  Objective:  BP 122/68    Pulse 86    Temp 98 F (36.7 C) (Temporal)    Ht 6\' 1"  (1.854 m)    Wt 219 lb 1 oz (99.4 kg)    SpO2 97%    BMI 28.90 kg/m  Wt Readings from Last 3 Encounters:  03/23/21 219 lb 1 oz (99.4 kg)  03/19/21 214 lb (97.1 kg)  02/24/21 217 lb (98.4 kg)      Physical Exam Vitals and nursing note reviewed.  Constitutional:      Appearance: Normal appearance. He is not ill-appearing.  Cardiovascular:     Rate and Rhythm: Normal rate and regular rhythm.     Pulses: Normal pulses.     Heart sounds: Normal heart sounds. No murmur heard. Pulmonary:     Effort: Pulmonary effort is normal. No respiratory distress.     Breath sounds: Normal breath sounds. No wheezing, rhonchi or rales.  Musculoskeletal:     Right lower leg: No edema.     Left lower leg: No edema.  Skin:    General: Skin is warm and dry.     Findings: No rash.  Neurological:     Mental  Status: He is alert.  Psychiatric:        Mood and Affect: Mood normal.        Behavior: Behavior normal.      Results for orders placed or performed in visit on 03/19/21  Microscopic Examination   Urine  Result Value Ref Range   WBC, UA None seen 0 - 5 /hpf   RBC None seen 0 - 2 /hpf   Epithelial Cells (non renal) None seen 0 - 10 /hpf   Bacteria, UA None seen None seen/Few  Urinalysis, Complete  Result Value Ref Range   Specific Gravity, UA 1.015 1.005 - 1.030   pH, UA 6.0 5.0 - 7.5   Color, UA Yellow Yellow   Appearance Ur Clear Clear   Leukocytes,UA Negative Negative   Protein,UA Negative Negative/Trace   Glucose, UA Negative Negative   Ketones, UA Negative Negative   RBC, UA Negative Negative   Bilirubin, UA Negative Negative   Urobilinogen, Ur 0.2 0.2 - 1.0 mg/dL   Nitrite, UA Negative Negative   Microscopic Examination See below:     Assessment & Plan:  This visit occurred during the SARS-CoV-2 public health emergency.  Safety protocols were in place, including screening questions prior to the visit, additional usage of staff PPE, and extensive cleaning of exam room while observing appropriate contact time as indicated for disinfecting solutions.   Problem List Items Addressed This Visit     Ex-smoker    Congratulated on ongoing abstinence.       Pulmonary nodules - Primary    Incidentally discovered pulmonary nodules on recent non contrasted CT abdomen and pelvis. Nodules are small - which is reassuring, however in smoking history did discuss checking non-contrasted CT chest to fully evaluate lungs. Pt agrees with plan.       Relevant Orders   CT Chest Wo Contrast   Kidney stones    Recent R ureterolithiasis s/p ESWL through urology.  Appreciate uro care.       Other Visit Diagnoses     Need for influenza vaccination       Relevant Orders   Flu Vaccine QUAD 26mo+IM (Fluarix, Fluzone & Alfiuria Quad PF) (Completed)        No orders of the defined  types were placed in this encounter.  Orders Placed This Encounter  Procedures   CT Chest Wo Contrast    Standing Status:   Future    Standing Expiration Date:   03/23/2022    Order Specific Question:   Preferred imaging location?    Answer:  [facility]   Flu Vaccine QUAD 33mo+IM (Fluarix, Fluzone & Alfiuria Quad PF)     Patient Instructions  Flu shot today  CT showed incidental finding of few small lung nodules at the lung bases.  We will set you up for chest CT scan over the next few weeks.   Follow up plan: Return if symptoms worsen or fail to improve.  Eustaquio Boyden, MD

## 2021-03-23 NOTE — Assessment & Plan Note (Addendum)
Recent R ureterolithiasis s/p ESWL through urology.  Appreciate uro care.

## 2021-03-23 NOTE — Patient Instructions (Addendum)
Flu shot today  CT showed incidental finding of few small lung nodules at the lung bases.  We will set you up for chest CT scan over the next few weeks.

## 2021-04-01 ENCOUNTER — Encounter: Payer: Self-pay | Admitting: *Deleted

## 2021-04-09 NOTE — Telephone Encounter (Signed)
Pt called back and is aware of where to call to schedule his CT  Nothing further needed.

## 2021-04-09 NOTE — Telephone Encounter (Signed)
I have called patient x 2 Mychart message sent to patient x 2  LM today for patient to make him aware of where to schedule his CT  Sent another mychart message  Forwarded to PCP as Lorain Childes

## 2021-04-23 ENCOUNTER — Ambulatory Visit: Payer: 59 | Admitting: Urology

## 2021-05-20 ENCOUNTER — Ambulatory Visit
Admission: RE | Admit: 2021-05-20 | Discharge: 2021-05-20 | Disposition: A | Discharge status: Home / Self Care | Payer: 59 | Attending: Urology | Admitting: Urology

## 2021-05-20 ENCOUNTER — Other Ambulatory Visit: Payer: Self-pay

## 2021-05-20 ENCOUNTER — Ambulatory Visit: Payer: 59 | Admitting: Urology

## 2021-05-20 ENCOUNTER — Ambulatory Visit
Admission: RE | Admit: 2021-05-20 | Discharge: 2021-05-20 | Disposition: A | Discharge status: Home / Self Care | Payer: 59 | Source: Ambulatory Visit | Attending: Urology | Admitting: Urology

## 2021-05-20 ENCOUNTER — Encounter: Payer: Self-pay | Admitting: Urology

## 2021-05-20 VITALS — BP 114/83 | HR 97 | Temp 98.0°F | Ht 73.0 in | Wt 219.0 lb

## 2021-05-20 DIAGNOSIS — R3129 Other microscopic hematuria: Secondary | ICD-10-CM | POA: Diagnosis present

## 2021-05-20 DIAGNOSIS — N200 Calculus of kidney: Secondary | ICD-10-CM | POA: Diagnosis present

## 2021-05-20 NOTE — Patient Instructions (Addendum)
Scheduling for CT- [phone] ? ?Follow up here in one year with your KUB xray prior to office visit.  ?

## 2021-05-20 NOTE — Progress Notes (Signed)
05/20/2021 ?1:42 PM  ? ?Justin Webb ?1975-08-25 ?[id number] ? ?Referring provider: Ria Bush, MD ?[address],  [place] [address] ? ?Chief Complaint  ?Patient presents with  ? Follow-up  ? ?Urological history: ?1.  Nephrolithiasis ? 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 ?HPI: ?Justin Webb is a 46 y.o. who is status post ESWL who presents today for follow up. ? ?Underwent ESWL on 02/26/2021 for 5 mm right ureteral stone with Dr. Bernardo Heater.  Their postprocedural course was as expected and uneventful.    ? ?KUB negative for right ureteral stone ? ?He is not having symptoms at this time.  Patient denies any modifying or aggravating factors.  Patient denies any gross hematuria, dysuria or suprapubic/flank pain.  Patient denies any fevers, chills, nausea or vomiting.   ? ?PMH: ?Past Medical History:  ?Diagnosis Date  ? Anxiety   ? Childhood asthma   ? Ex-smoker quit 10/2012  ? Kidney stone 12/2011  ? R  ? Pancreatitis   ? Testicular torsion 1995  ? left  ? ? ?Surgical History: ?Past Surgical History:  ?Procedure Laterality Date  ? exercise treadmill  10/2012  ? WNL, excellent exercise tolerance  ? EXTRACORPOREAL SHOCK WAVE LITHOTRIPSY Right 02/26/2021  ? Procedure: EXTRACORPOREAL SHOCK WAVE LITHOTRIPSY (ESWL);  Surgeon: Abbie Sons, MD;  Location: [facility] ORS;  Service: Urology;  Laterality: Right;  ? testicular torsion  1995  ? left  ? ? ?Home Medications:  ?Current Outpatient Medications on File Prior to Visit  ?Medication Sig Dispense Refill  ? busPIRone (BUSPAR) 5 MG tablet Take 1 tablet (5 mg total) by mouth daily. 90 tablet 3  ? gabapentin (NEURONTIN) 300 MG capsule Take 1 capsule (300 mg total) by mouth at bedtime. 90 capsule 3  ? ibuprofen (ADVIL,MOTRIN) 200 MG tablet Take 200 mg by mouth every 6 (six) hours as needed for pain.    ? LORazepam (ATIVAN) 0.5 MG tablet TAKE 1 TABLET BY MOUTH 2 TIMES DAILY AS NEEDED. 30 tablet 0  ? Melatonin 5 MG TABS Take 1 tablet by  mouth at bedtime.    ? ?No current facility-administered medications on file prior to visit.  ? ? ?Allergies: No Known Allergies ? ?Family History: ?Family History  ?Problem Relation Age of Onset  ? Healthy Father   ? Heart attack Mother   ?     slight; + smoker; Mid 40's  ? Diabetes Maternal Grandmother   ? Other Paternal Grandfather   ?     Brain tumor  ? ? ?Social History:  reports that he has been smoking cigarettes. He has a 2.50 pack-year smoking history. He has never used smokeless tobacco. He reports that he does not drink alcohol and does not use drugs. ? ?ROS: ?Pertinent ROS in HPI ? ?Physical Exam: ?BP 114/83   Pulse 97   Temp 98 ?F (36.7 ?C)   Ht 6\' 1"  (1.854 m)   Wt 219 lb (99.3 kg)   SpO2 96%   BMI 28.89 kg/m?   ?Constitutional:  Well nourished. Alert and oriented, No acute distress. ?HEENT: [place] AT, mask in place.   Trachea midline. ?Cardiovascular: No clubbing, cyanosis, or edema. ?Respiratory: Normal respiratory effort, no increased work of breathing. ?Neurologic: Grossly intact, no focal deficits, moving all 4 extremities. ?Psychiatric: Normal mood and affect. ? ?Laboratory Data: ?Lab Results  ?Component Value Date  ? WBC 5.4 02/23/2021  ? HGB 15.2 02/23/2021  ? HCT 45.4 02/23/2021  ? MCV 91.7 02/23/2021  ?  PLT 238 02/23/2021  ? ? ?Lab Results  ?Component Value Date  ? CREATININE 1.09 02/23/2021  ? ? ?Urinalysis ?Results for orders placed or performed in visit on 03/19/21  ?Microscopic Examination  ? Urine  ?Result Value Ref Range  ? WBC, UA None seen 0 - 5 /hpf  ? RBC None seen 0 - 2 /hpf  ? Epithelial Cells (non renal) None seen 0 - 10 /hpf  ? Bacteria, UA None seen None seen/Few  ?Urinalysis, Complete  ?Result Value Ref Range  ? Specific Gravity, UA 1.015 1.005 - 1.030  ? pH, UA 6.0 5.0 - 7.5  ? Color, UA Yellow Yellow  ? Appearance Ur Clear Clear  ? Leukocytes,UA Negative Negative  ? Protein,UA Negative Negative/Trace  ? Glucose, UA Negative Negative  ? Ketones, UA Negative Negative  ? RBC,  UA Negative Negative  ? Bilirubin, UA Negative Negative  ? Urobilinogen, Ur 0.2 0.2 - 1.0 mg/dL  ? Nitrite, UA Negative Negative  ? Microscopic Examination See below:   ?I have reviewed the labs. ? ? ?Pertinent Imaging: ?CLINICAL DATA:  Kidney stones ?  ?EXAM: ?ABDOMEN - 1 VIEW ?  ?COMPARISON:  Prior radiograph 03/19/2021 ?  ?FINDINGS: ?The bowel gas pattern is normal. No radio-opaque calculi or other ?significant radiographic abnormality are seen. ?  ?IMPRESSION: ?Negative. ?  ?No radiopaque stones visualized. ?  ?  ?Electronically Signed ?  By: Jacqulynn Cadet M.D. ?  On: 05/23/2021 06:50 ?I have independently reviewed the films.  See HPI ? ?Assessment & Plan:   ? ?1. Nephrolithiasis ?-no distinct stone is seen on today's KUB ?-patient not having symptoms at this visit ?-given the book "ABC's of stone prevention" ?-KUB in one year ? ?2. Microscopic hematuria ?-resolved ? ?Return in about 1 year (around 05/21/2022) for KUB . ? ?These notes generated with voice recognition software. I apologize for typographical errors. ? ?Meggie Laseter, PA-C ? ?[address] ?[place], [place] [address] ?[phone]  ?

## 2021-07-01 ENCOUNTER — Other Ambulatory Visit: Payer: Self-pay | Admitting: Family Medicine

## 2021-07-01 NOTE — Telephone Encounter (Signed)
Last filled 09-23-20 #30 ?Last OV 03-23-21 ?No Future OV ?[facility] ?

## 2021-07-03 NOTE — Telephone Encounter (Signed)
ERx 

## 2021-08-13 ENCOUNTER — Other Ambulatory Visit: Payer: Self-pay | Admitting: Family Medicine

## 2021-08-14 NOTE — Telephone Encounter (Signed)
ERx 

## 2021-08-14 NOTE — Telephone Encounter (Signed)
Name of Medication: Lorazepam Name of Pharmacy: [facility] Last Fill or Written Date and Quantity: 07/03/21, #30 Last Office Visit and Type: 03/23/21, pulmonary nodule Next Office Visit and Type: none Last Controlled Substance Agreement Date: none Last UDS: none

## 2021-09-14 ENCOUNTER — Other Ambulatory Visit: Payer: Self-pay | Admitting: Family Medicine

## 2021-09-14 NOTE — Telephone Encounter (Signed)
Name of Medication: Lorazepam Name of Pharmacy: [facility] Last Fill or Written Date and Quantity: 08/14/21, #30 Last Office Visit and Type: 03/23/21, review imaging findings from [facility] Next Office Visit and Type: none Last Controlled Substance Agreement Date: none Last UDS: none

## 2021-09-15 NOTE — Telephone Encounter (Signed)
ERx 

## 2021-10-02 ENCOUNTER — Ambulatory Visit (INDEPENDENT_AMBULATORY_CARE_PROVIDER_SITE_OTHER): Payer: 59 | Admitting: Family Medicine

## 2021-10-02 ENCOUNTER — Encounter: Payer: Self-pay | Admitting: Family Medicine

## 2021-10-02 VITALS — BP 124/80 | HR 88 | Temp 97.2°F | Ht 73.0 in | Wt 209.0 lb

## 2021-10-02 DIAGNOSIS — R197 Diarrhea, unspecified: Secondary | ICD-10-CM | POA: Diagnosis not present

## 2021-10-02 MED ORDER — CIPROFLOXACIN HCL 500 MG PO TABS: 500.0000 mg | ORAL_TABLET | Freq: Two times a day (BID) | ORAL | 0 refills | Status: AC

## 2021-10-02 NOTE — Assessment & Plan Note (Signed)
10d h/o loose watery stools, more malodorous than normal, associated with generalized abd discomfort/cramping. Anticipate infectious enteritis - Rx cipro 500mg  BID course x3 days. ?IBS component given associated cramping that improves with defecation.  rec start probiotic course for at least a few weeks after finishes antibiotics.  May restart metamucil wafers after GI symptoms better to treat prior constipation difficulty.

## 2021-10-02 NOTE — Patient Instructions (Addendum)
For diarrhea, take antibiotic cipro 500mg  twice daily for 3 days.  Afterwards, start probiotic like activia yogurt or align or philips colon health pills daily to replenish normal gut bacteria.  Once feeling better, do recommend continuing metamucil wafers. Let know if ongoing symptoms despite above.

## 2021-10-02 NOTE — Progress Notes (Signed)
Patient ID: Justin Webb, male    DOB: 1976/01/27, 46 y.o.   MRN: [id number]  This visit was conducted in person.  BP 124/80   Pulse 88   Temp (!) 97.2 F (36.2 C) (Temporal)   Ht 6\' 1"  (1.854 m)   Wt 209 lb (94.8 kg)   SpO2 96%   BMI 27.57 kg/m    CC: GI problem  Subjective:   HPI: Justin Webb is a 46 y.o. male presenting on 10/02/2021 for GI Problem (C/o diarrhea, abd pain/cramps and abnormal BM smell.  Denies nausea/vomiting. Sxs started 10-12 days ago. Same sxs of stomach inf in 2020. )   1.5 wk h/o - diarrhea loose and watery stools 3-4x/day. Worse smell than usual. Also with abd pain/cramping prior to BM, resolved with BM.  Hasn't noted food triggers.   No black or tarry stools, no blood in stool. No fevers/chills, nausea, vomiting.  No new foods, medicines, supplements.  No sick contacts at home.   He did start metamucil wafer daily to every other day - started this several months ago, but stopped once he started feeling worse. Drinking water regularly.   Changing diet to healthier options - less fried foods, more grilled foods, stopped beer and soft drinks.   Similar episode 2020 s/p treatment with flagyl with benefit. C diff test negative at that time.      Relevant past medical, surgical, family and social history reviewed and updated as indicated. Interim medical history since our last visit reviewed. Allergies and medications reviewed and updated. Outpatient Medications Prior to Visit  Medication Sig Dispense Refill   busPIRone (BUSPAR) 5 MG tablet Take 1 tablet (5 mg total) by mouth daily. (Patient taking differently: Take 5 mg by mouth daily. As needed) 90 tablet 3   ibuprofen (ADVIL,MOTRIN) 200 MG tablet Take 200 mg by mouth every 6 (six) hours as needed for pain.     LORazepam (ATIVAN) 0.5 MG tablet TAKE 1 TABLET BY MOUTH TWICE A DAY AS NEEDED (Patient taking differently: Takes once a day) 30 tablet 0   Melatonin 5 MG TABS Take 1 tablet by  mouth at bedtime.     gabapentin (NEURONTIN) 300 MG capsule Take 1 capsule (300 mg total) by mouth at bedtime. 90 capsule 3   No facility-administered medications prior to visit.     Per HPI unless specifically indicated in ROS section below Review of Systems  Objective:  BP 124/80   Pulse 88   Temp (!) 97.2 F (36.2 C) (Temporal)   Ht 6\' 1"  (1.854 m)   Wt 209 lb (94.8 kg)   SpO2 96%   BMI 27.57 kg/m   Wt Readings from Last 3 Encounters:  10/02/21 209 lb (94.8 kg)  05/20/21 219 lb (99.3 kg)  03/23/21 219 lb 1 oz (99.4 kg)      Physical Exam Vitals and nursing note reviewed.  Constitutional:      Appearance: Normal appearance. He is not ill-appearing.  HENT:     Mouth/Throat:     Mouth: Mucous membranes are moist.     Pharynx: Oropharynx is clear. No oropharyngeal exudate or posterior oropharyngeal erythema.  Eyes:     Extraocular Movements: Extraocular movements intact.     Pupils: Pupils are equal, round, and reactive to light.  Cardiovascular:     Rate and Rhythm: Normal rate and regular rhythm.     Pulses: Normal pulses.     Heart sounds: Normal heart sounds. No murmur  heard. Pulmonary:     Effort: Pulmonary effort is normal. No respiratory distress.     Breath sounds: Normal breath sounds. No wheezing, rhonchi or rales.  Abdominal:     General: Bowel sounds are normal. There is no distension.     Palpations: Abdomen is soft. There is no mass.     Tenderness: There is no abdominal tenderness. There is no guarding or rebound. Negative signs include Murphy's sign.     Hernia: No hernia is present.  Musculoskeletal:     Right lower leg: No edema.     Left lower leg: No edema.  Skin:    General: Skin is warm and dry.     Findings: No rash.  Neurological:     Mental Status: He is alert.  Psychiatric:        Mood and Affect: Mood normal.        Behavior: Behavior normal.        Assessment & Plan:   Problem List Items Addressed This Visit     Diarrhea -  Primary    10d h/o loose watery stools, more malodorous than normal, associated with generalized abd discomfort/cramping. Anticipate infectious enteritis - Rx cipro 500mg  BID course x3 days. ?IBS component given associated cramping that improves with defecation.  rec start probiotic course for at least a few weeks after finishes antibiotics.  May restart metamucil wafers after GI symptoms better to treat prior constipation difficulty.         Meds ordered this encounter  Medications   ciprofloxacin (CIPRO) 500 MG tablet    Sig: Take 1 tablet (500 mg total) by mouth 2 (two) times daily for 3 days.    Dispense:  6 tablet    Refill:  0   No orders of the defined types were placed in this encounter.    Patient Instructions  For diarrhea, take antibiotic cipro 500mg  twice daily for 3 days.  Afterwards, start probiotic like activia yogurt or align or philips colon health pills daily to replenish normal gut bacteria.  Once feeling better, do recommend continuing metamucil wafers. Let know if ongoing symptoms despite above.   Follow up plan: Return if symptoms worsen or fail to improve.  , MD

## 2021-10-14 ENCOUNTER — Other Ambulatory Visit: Payer: Self-pay | Admitting: Family Medicine

## 2021-10-15 NOTE — Telephone Encounter (Signed)
Name of Medication: Lorazepam Name of Pharmacy: [facility] Last Fill or Written Date and Quantity: 09/15/21, #30 Last Office Visit and Type: 10/02/21, diarrhea Next Office Visit and Type: none Last Controlled Substance Agreement Date: none Last UDS: none

## 2021-10-16 NOTE — Telephone Encounter (Signed)
ERx 

## 2021-11-17 ENCOUNTER — Other Ambulatory Visit: Payer: Self-pay | Admitting: Family Medicine

## 2021-11-18 NOTE — Telephone Encounter (Signed)
ERx 

## 2021-11-18 NOTE — Telephone Encounter (Signed)
Name of Medication: Lorazepam Name of Pharmacy: [facility] Last Fill or Written Date and Quantity: 10/16/21, #30 Last Office Visit and Type: 10/12/21 Next Office Visit and Type: none Last Controlled Substance Agreement Date: none Last UDS: none

## 2021-12-18 ENCOUNTER — Other Ambulatory Visit: Payer: Self-pay | Admitting: Family Medicine

## 2021-12-18 NOTE — Telephone Encounter (Signed)
ERx 

## 2021-12-18 NOTE — Telephone Encounter (Signed)
Name of Medication: Lorazepam Name of Pharmacy: [facility] Last Fill or Written Date and Quantity: 11/19/21, #30 Last Office Visit and Type: 10/02/21, diarrhea Next Office Visit and Type: none Last Controlled Substance Agreement Date: none Last UDS: none  Gabapentin (not on current med list) last filled: 01/15/21, #90?

## 2022-01-19 ENCOUNTER — Other Ambulatory Visit: Payer: Self-pay | Admitting: Family Medicine

## 2022-01-19 NOTE — Telephone Encounter (Signed)
ERx 

## 2022-02-17 ENCOUNTER — Ambulatory Visit: Payer: 59 | Admitting: Family Medicine

## 2022-02-17 ENCOUNTER — Encounter: Payer: Self-pay | Admitting: Family Medicine

## 2022-02-17 VITALS — BP 116/72 | HR 86 | Temp 97.3°F | Ht 73.0 in | Wt 218.0 lb

## 2022-02-17 DIAGNOSIS — Z23 Encounter for immunization: Secondary | ICD-10-CM

## 2022-02-17 DIAGNOSIS — R229 Localized swelling, mass and lump, unspecified: Secondary | ICD-10-CM | POA: Diagnosis not present

## 2022-02-17 NOTE — Progress Notes (Signed)
Patient ID: Justin Webb, male    DOB: 03/07/1975, 46 y.o.   MRN: [id number]  This visit was conducted in person.  BP 116/72   Pulse 86   Temp (!) 97.3 F (36.3 C) (Temporal)   Ht 6\' 1"  (1.854 m)   Wt 218 lb (98.9 kg)   SpO2 97%   BMI 28.76 kg/m    CC: check lumps on left side Subjective:   HPI: Justin Webb is a 46 y.o. male presenting on 02/17/2022 for Mass (C/o multiple lumps on L side, come and go. Areas are painful to touch. Noticed 2-3 wks ago. )   Lumps to left lateral ribcage off and on present for last 2-3 weeks, more noticeable when laying on floor playing with dog. Lumps come and go. No redness, skin changes.  Currently not noticing significant pain or lumps.    Denies inciting trauma or injury.   No fevers/chills, cough, skin infection, weight change, fatigue or other localizing symptoms.      Relevant past medical, surgical, family and social history reviewed and updated as indicated. Interim medical history since our last visit reviewed. Allergies and medications reviewed and updated. Outpatient Medications Prior to Visit  Medication Sig Dispense Refill   busPIRone (BUSPAR) 5 MG tablet Take 1 tablet (5 mg total) by mouth daily. (Patient taking differently: Take 5 mg by mouth daily. As needed) 90 tablet 3   ibuprofen (ADVIL,MOTRIN) 200 MG tablet Take 200 mg by mouth every 6 (six) hours as needed for pain.     LORazepam (ATIVAN) 0.5 MG tablet TAKE 1 TABLET BY MOUTH TWICE A DAY AS NEEDED 30 tablet 0   Melatonin 5 MG TABS Take 1 tablet by mouth at bedtime.     gabapentin (NEURONTIN) 300 MG capsule TAKE 1 CAPSULE BY MOUTH EVERYDAY AT BEDTIME 90 capsule 0   No facility-administered medications prior to visit.     Per HPI unless specifically indicated in ROS section below Review of Systems  Objective:  BP 116/72   Pulse 86   Temp (!) 97.3 F (36.3 C) (Temporal)   Ht 6\' 1"  (1.854 m)   Wt 218 lb (98.9 kg)   SpO2 97%   BMI 28.76 kg/m   Wt  Readings from Last 3 Encounters:  02/17/22 218 lb (98.9 kg)  10/02/21 209 lb (94.8 kg)  05/20/21 219 lb (99.3 kg)      Physical Exam Vitals and nursing note reviewed.  Constitutional:      Appearance: Normal appearance. He is not ill-appearing.  Chest:     Chest wall: No mass, swelling or tenderness.  Breasts:    Right: Normal. No swelling, inverted nipple, mass, nipple discharge, skin change or tenderness.     Left: Normal. No swelling, inverted nipple, mass, nipple discharge, skin change or tenderness.  Musculoskeletal:        General: Normal range of motion.  Lymphadenopathy:     Head:     Right side of head: No submental, submandibular, tonsillar, preauricular, posterior auricular or occipital adenopathy.     Left side of head: No submental, submandibular, tonsillar, preauricular, posterior auricular or occipital adenopathy.     Cervical: No cervical adenopathy.     Upper Body:     Right upper body: No supraclavicular, axillary or pectoral adenopathy.     Left upper body: No supraclavicular, axillary or pectoral adenopathy.  Skin:    General: Skin is warm and dry.     Findings: No rash.  Neurological:     Mental Status: He is alert.  Psychiatric:        Mood and Affect: Mood normal.        Behavior: Behavior normal.        Lab Results  Component Value Date   WBC 5.4 02/23/2021   HGB 15.2 02/23/2021   HCT 45.4 02/23/2021   MCV 91.7 02/23/2021   PLT 238 02/23/2021   Assessment & Plan:   Problem List Items Addressed This Visit     Lump of skin - Primary    Localized to left lateral ribcage - ?lipoma vs transient lymphadenopathy. Normal exam today. No localizing symptoms. Anticipate benign process. Update if persistent or enlarging.       Other Visit Diagnoses     Need for influenza vaccination       Relevant Orders   Flu Vaccine QUAD 70mo+IM (Fluarix, Fluzone & Alfiuria Quad PF) (Completed)        No orders of the defined types were placed in this  encounter.  Orders Placed This Encounter  Procedures   Flu Vaccine QUAD 69mo+IM (Fluarix, Fluzone & Alfiuria Quad PF)     Patient Instructions  Flu shot today Possible swollen lymph nodes from transient inflammation.  Possible lipoma - but if that's the case , would expect lumps to remain.  Today exam is ok.  Let us know if enlarging, worsening Schedule physical in 6 months  Follow up plan: Return in about 6 months (around 08/19/2022) for annual exam, prior fasting for blood work.  Eustaquio Boyden, MD

## 2022-02-17 NOTE — Patient Instructions (Addendum)
Flu shot today Possible swollen lymph nodes from transient inflammation.  Possible lipoma - but if that's the case , would expect lumps to remain.  Today exam is ok.  Let us know if enlarging, worsening Schedule physical in 6 months

## 2022-02-17 NOTE — Assessment & Plan Note (Signed)
Localized to left lateral ribcage - ?lipoma vs transient lymphadenopathy. Normal exam today. No localizing symptoms. Anticipate benign process. Update if persistent or enlarging.

## 2022-02-23 ENCOUNTER — Other Ambulatory Visit: Payer: Self-pay | Admitting: Family Medicine

## 2022-02-23 DIAGNOSIS — F411 Generalized anxiety disorder: Secondary | ICD-10-CM

## 2022-02-24 NOTE — Telephone Encounter (Signed)
ERx 

## 2022-02-24 NOTE — Telephone Encounter (Signed)
Name of Medication: Lorazepam Name of Pharmacy: [facility] Last Fill or Written Date and Quantity: 01/19/22, #30 Last Office Visit and Type: 02/17/22, lump under skin at ribcage Next Office Visit and Type: none Last Controlled Substance Agreement Date: none Last UDS: none

## 2022-03-31 ENCOUNTER — Other Ambulatory Visit: Payer: Self-pay | Admitting: Family Medicine

## 2022-03-31 DIAGNOSIS — F411 Generalized anxiety disorder: Secondary | ICD-10-CM

## 2022-03-31 NOTE — Telephone Encounter (Signed)
Refill request Ativan Last refill 02/24/22 #30 Last office visit 02/17/22

## 2022-04-01 NOTE — Telephone Encounter (Signed)
ERx 

## 2022-04-29 ENCOUNTER — Other Ambulatory Visit: Payer: Self-pay | Admitting: Family Medicine

## 2022-04-29 DIAGNOSIS — F411 Generalized anxiety disorder: Secondary | ICD-10-CM

## 2022-04-29 NOTE — Telephone Encounter (Signed)
ERx 

## 2022-04-29 NOTE — Telephone Encounter (Signed)
Name of Medication: Lorazepam Name of Pharmacy: [facility] Last Fill or Written Date and Quantity: 04/01/22, #30 Last Office Visit and Type: 02/17/22, lump under skin at ribcage Next Office Visit and Type: none Last Controlled Substance Agreement Date: none Last UDS: none

## 2022-05-24 ENCOUNTER — Other Ambulatory Visit: Payer: Self-pay | Admitting: Family Medicine

## 2022-05-24 DIAGNOSIS — N200 Calculus of kidney: Secondary | ICD-10-CM

## 2022-05-24 NOTE — Progress Notes (Unsigned)
05/25/2022 3:10 PM   Justin Webb [birth year] [id number]  Referring provider: Ria Bush, MD [address] [place],  [place] [address]  Chief Complaint  Patient presents with   Other    KUB   Urological history: 1.  Nephrolithiasis -Stone composition unknown -right ESWL (2022)   2. Testicular torsion -Left orchiectomy 1995  HPI: Justin Webb is a 47 y.o. who presents today for yearly follow up.   He has not had any issues with passage of fragments, renal colic or gross hematuria.  Patient denies any modifying or aggravating factors.  Patient denies any dysuria or suprapubic/flank pain.  Patient denies any fevers, chills, nausea or vomiting.    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     LORazepam (ATIVAN) 0.5 MG tablet TAKE 1 TABLET BY MOUTH TWICE A DAY AS NEEDED 30 tablet 1   Melatonin 5 MG TABS Take 1 tablet by mouth at bedtime.     No current facility-administered medications on file prior to visit.    Allergies: No Known Allergies  Family History: Family History  Problem Relation Age of Onset   Healthy Father    Heart attack Mother        slight; + smoker; Mid 20's   Diabetes Maternal  Grandmother    Other Paternal Grandfather        Brain tumor    Social History:  reports that he has been smoking cigarettes. He has a 2.50 pack-year smoking history. He has never used smokeless tobacco. He reports that he does not drink alcohol and does not use drugs.  ROS: Pertinent ROS in HPI  Physical Exam: BP 117/82   Pulse 92   Ht 6\' 1"  (1.854 m)   Wt 218 lb 6.4 oz (99.1 kg)   BMI 28.81 kg/m   Constitutional:  Well nourished. Alert and oriented, No acute distress. HEENT: [place] AT, moist mucus membranes.  Trachea midline Cardiovascular: No clubbing, cyanosis, or edema. Respiratory: Normal respiratory effort, no increased work of breathing. Neurologic: Grossly intact, no focal deficits, moving all 4 extremities. Psychiatric: Normal mood and affect.   Laboratory Data: N/A    Pertinent Imaging: Narrative & Impression  CLINICAL DATA:  Renal stone.  Lithotripsy 02/17/2021   EXAM: ABDOMEN - 1 VIEW   COMPARISON:  diagnostic abdominal radiograph 05/20/2021   FINDINGS: The bowel gas pattern is normal. No radio-opaque calculi or other significant radiographic abnormality are seen.   IMPRESSION: Negative.     Electronically Signed   By: Lovey Newcomer M.D.   On: 05/27/2022 12:40   I have independently reviewed the films.  See HPI  Assessment & Plan:  1. Nephrolithiasis -no distinct stone is seen on today's KUB -patient has not had any issues with stones over the last year -given the book "ABC's of stone prevention"  Return if symptoms worsen or fail to improve.  These notes generated with voice recognition software. I apologize for typographical errors.  [place], [place] [address]  [place], [place] [address] [phone]

## 2022-05-25 ENCOUNTER — Encounter: Payer: Self-pay | Admitting: Urology

## 2022-05-25 ENCOUNTER — Ambulatory Visit
Admission: RE | Admit: 2022-05-25 | Discharge: 2022-05-25 | Disposition: A | Discharge status: Home / Self Care | Payer: 59 | Source: Ambulatory Visit | Attending: Urology | Admitting: Urology

## 2022-05-25 ENCOUNTER — Ambulatory Visit: Payer: 59 | Admitting: Urology

## 2022-05-25 ENCOUNTER — Ambulatory Visit
Admission: RE | Admit: 2022-05-25 | Discharge: 2022-05-25 | Disposition: A | Discharge status: Home / Self Care | Payer: 59 | Attending: Urology | Admitting: Urology

## 2022-05-25 VITALS — BP 117/82 | HR 92 | Ht 73.0 in | Wt 218.4 lb

## 2022-05-25 DIAGNOSIS — Z87442 Personal history of urinary calculi: Secondary | ICD-10-CM | POA: Diagnosis not present

## 2022-05-25 DIAGNOSIS — N200 Calculus of kidney: Secondary | ICD-10-CM

## 2022-07-01 ENCOUNTER — Other Ambulatory Visit: Payer: Self-pay | Admitting: Family Medicine

## 2022-07-01 DIAGNOSIS — F411 Generalized anxiety disorder: Secondary | ICD-10-CM

## 2022-07-01 NOTE — Telephone Encounter (Signed)
Name of Medication: Lorazepam Name of Pharmacy: [facility] Last Fill or Written Date and Quantity: 04/01/22, #30 Last Office Visit and Type: 02/17/22, lump under skin at ribcage Next Office Visit and Type: none Last Controlled Substance Agreement Date: none Last UDS: none 

## 2022-07-02 NOTE — Telephone Encounter (Signed)
ERx 

## 2022-07-12 IMAGING — CR DG ABDOMEN 1V
1 series · 2 of 2 positions shown · non-contrast
Comparison: Prior radiograph 03/19/2021

CLINICAL DATA: Kidney stones

EXAM:
ABDOMEN - 1 VIEW

[Series 1: dg abd 1 view · 0.14mm/px · 2 of 2 slices shown]
[im 1/2]
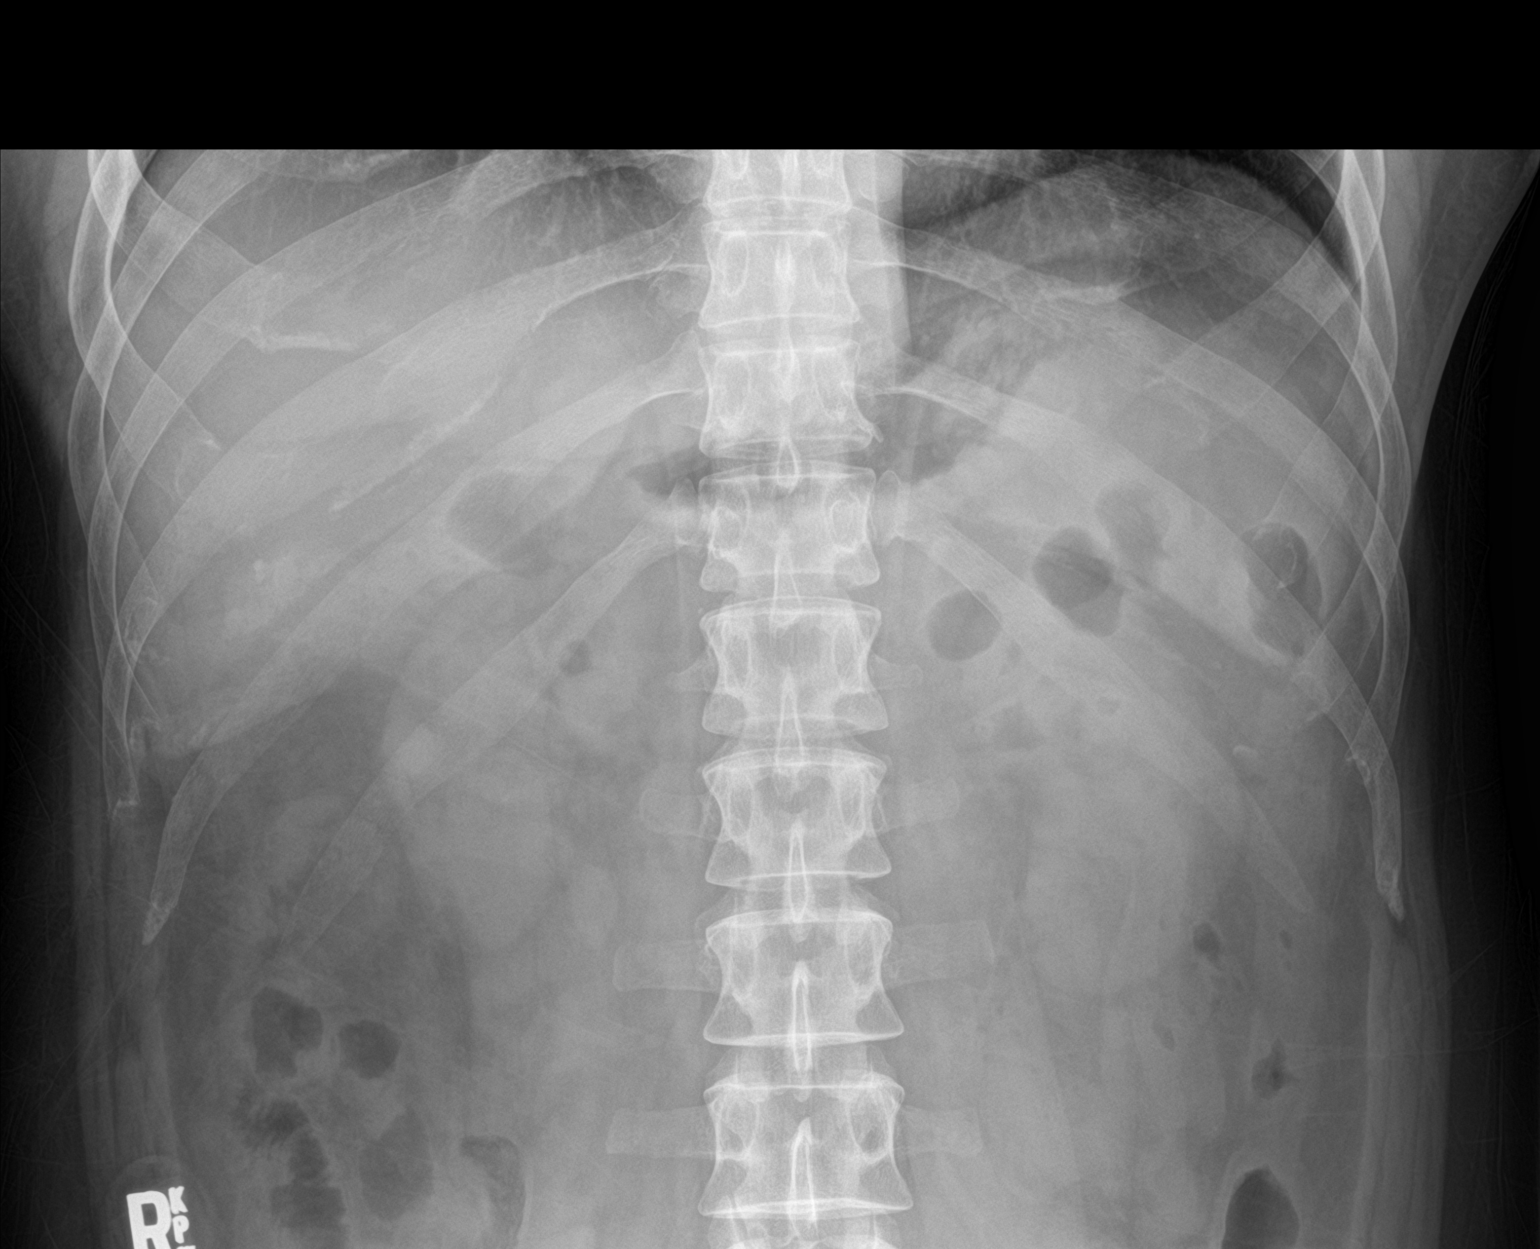
[im 2/2]
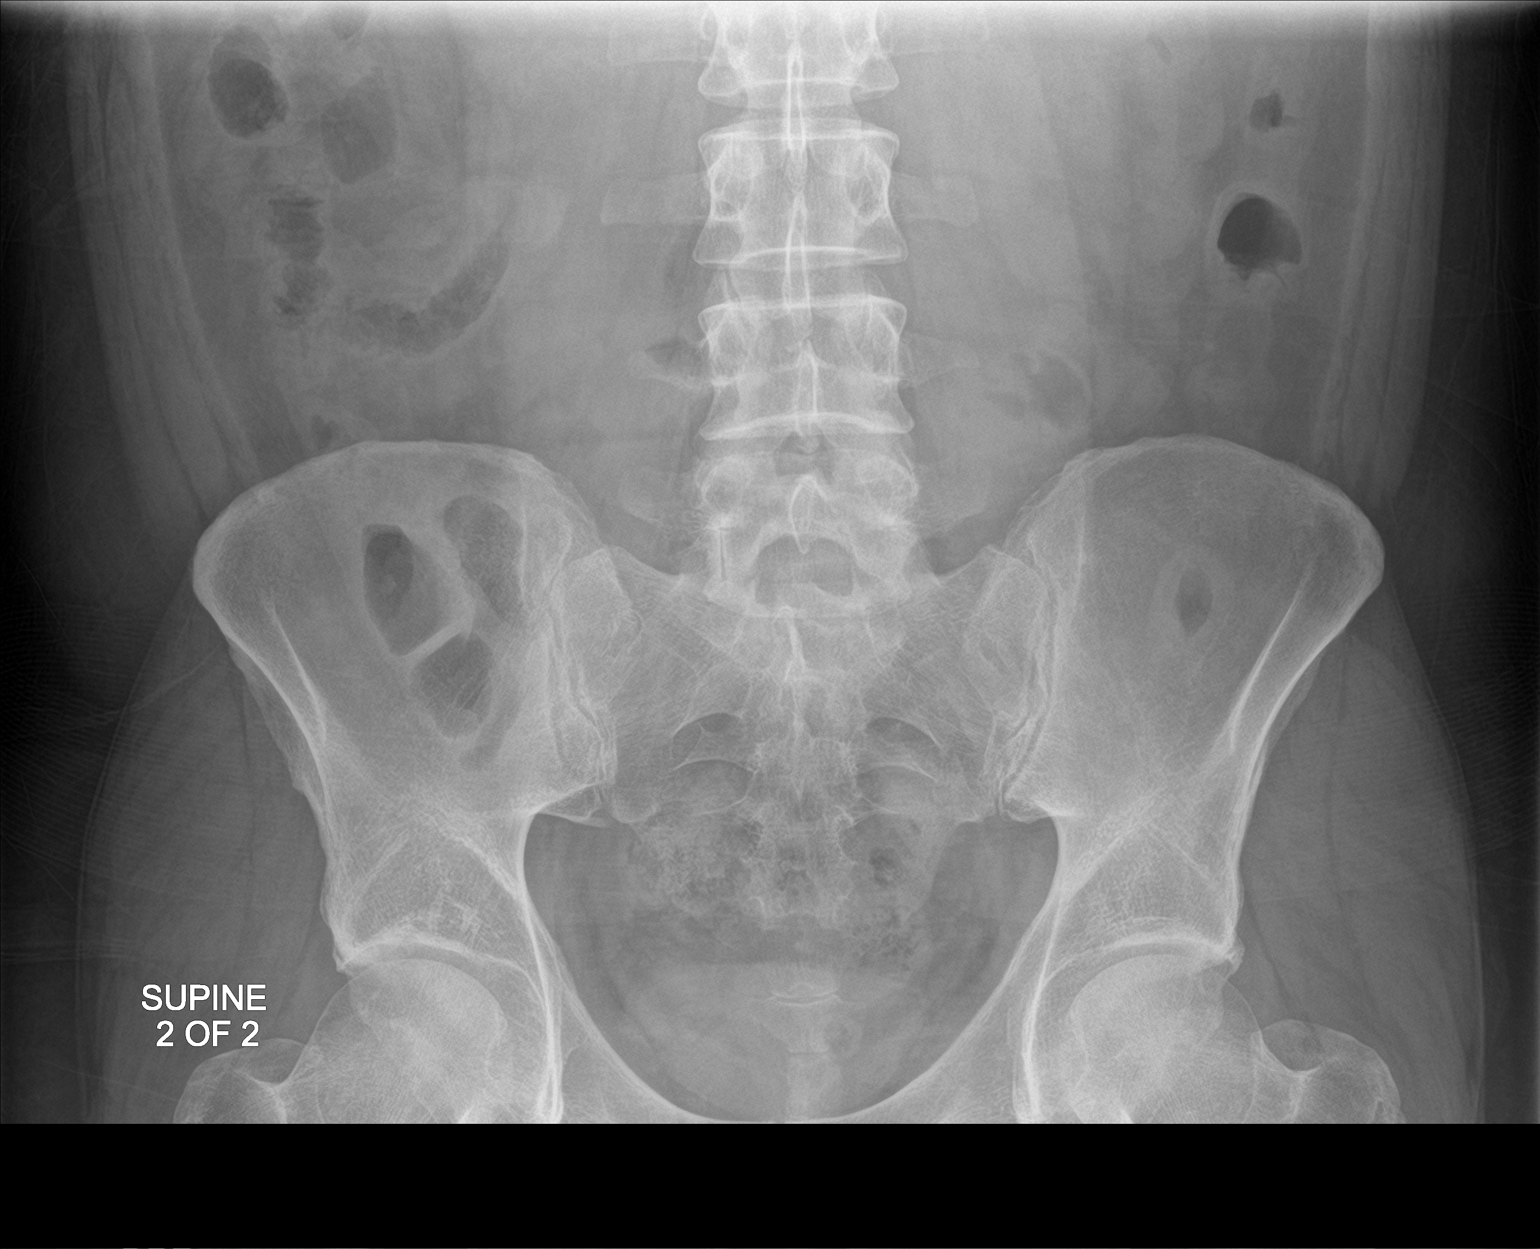

[2 of 2 positions shown; findings below may reference images not displayed]

FINDINGS: The bowel gas pattern is normal. No radio-opaque calculi or other
significant radiographic abnormality are seen.
IMPRESSION: Negative.

No radiopaque stones visualized.

## 2022-09-01 ENCOUNTER — Other Ambulatory Visit: Payer: Self-pay | Admitting: Family Medicine

## 2022-09-01 DIAGNOSIS — F411 Generalized anxiety disorder: Secondary | ICD-10-CM

## 2022-09-01 NOTE — Telephone Encounter (Signed)
Name of Medication: Lorazepam Name of Pharmacy: [facility] Last Fill or Written Date and Quantity: 08/02/22, #30 Last Office Visit and Type: 02/17/22, lump under skin at ribcage Next Office Visit and Type: none Last Controlled Substance Agreement Date: none Last UDS: none

## 2022-09-02 NOTE — Telephone Encounter (Signed)
ERx 

## 2022-10-27 ENCOUNTER — Encounter: Payer: Self-pay | Admitting: Family Medicine

## 2022-10-27 ENCOUNTER — Ambulatory Visit: Payer: 59 | Admitting: Family Medicine

## 2022-10-27 VITALS — BP 122/80 | HR 82 | Temp 98.0°F | Ht 73.0 in | Wt 211.0 lb

## 2022-10-27 DIAGNOSIS — F411 Generalized anxiety disorder: Secondary | ICD-10-CM | POA: Diagnosis not present

## 2022-10-27 DIAGNOSIS — Z0000 Encounter for general adult medical examination without abnormal findings: Secondary | ICD-10-CM | POA: Diagnosis not present

## 2022-10-27 DIAGNOSIS — K5900 Constipation, unspecified: Secondary | ICD-10-CM

## 2022-10-27 DIAGNOSIS — Z13220 Encounter for screening for lipoid disorders: Secondary | ICD-10-CM

## 2022-10-27 DIAGNOSIS — Z136 Encounter for screening for cardiovascular disorders: Secondary | ICD-10-CM | POA: Diagnosis not present

## 2022-10-27 DIAGNOSIS — Z1211 Encounter for screening for malignant neoplasm of colon: Secondary | ICD-10-CM | POA: Diagnosis not present

## 2022-10-27 DIAGNOSIS — Z23 Encounter for immunization: Secondary | ICD-10-CM

## 2022-10-27 DIAGNOSIS — Z87891 Personal history of nicotine dependence: Secondary | ICD-10-CM

## 2022-10-27 DIAGNOSIS — R5383 Other fatigue: Secondary | ICD-10-CM | POA: Diagnosis not present

## 2022-10-27 MED ORDER — BUSPIRONE HCL 10 MG PO TABS: 10.0000 mg | ORAL_TABLET | Freq: Every day | ORAL | 11 refills | Status: DC

## 2022-10-27 NOTE — Assessment & Plan Note (Signed)
Encouraged full cessation.  ?

## 2022-10-27 NOTE — Assessment & Plan Note (Signed)
Continue metamucil crackers PRN, encouraged good fiber and water in diet. Update TSH.

## 2022-10-27 NOTE — Progress Notes (Signed)
Ph: [phone] Fax: [phone]   Patient ID: Justin Webb, male    DOB: 1975/06/28, 47 y.o.   MRN: [id number]  This visit was conducted in person.  BP 122/80   Pulse 82   Temp 98 F (36.7 C) (Temporal)   Ht 6\' 1"  (1.854 m)   Wt 211 lb (95.7 kg)   SpO2 98%   BMI 27.84 kg/m    CC: CPE Subjective:   HPI: Justin Webb is a 47 y.o. male presenting on 10/27/2022 for Follow-up   Chronic anxiety managed with nightly ativan. Previously on buspar with benefit.   Noting more constipation recently. This is despite metamucil crackers.   Notes intermittent fatigue over last few weeks.   Preventative: Colon cancer screening - discussed options. Agrees to GI referral for colonoscopy in [place].  Prostate cancer screen not yet due as no fmhx Flu shot yearly  COVID vaccine - Pfizer x2 (will let us know dates) Td 2011 Seat belt use discussed Sunscreen use discussed. No changing moles on skin.  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 Interim medical history since our last visit reviewed. Allergies and medications reviewed and updated. Outpatient Medications Prior to Visit  Medication Sig Dispense Refill   ibuprofen (ADVIL,MOTRIN) 200 MG tablet Take 200 mg by mouth every 6 (six) hours as needed for pain.     LORazepam (ATIVAN) 0.5 MG tablet TAKE 1 TABLET BY MOUTH TWICE A DAY AS NEEDED 30 tablet 1   Melatonin 5 MG TABS Take 1 tablet by mouth at bedtime.     No facility-administered medications prior to visit.     Per HPI unless specifically indicated in ROS section below Review of Systems  Constitutional:   Negative for activity change, appetite change, chills, fatigue, fever and unexpected weight change.  HENT:  Negative for hearing loss.   Eyes:  Negative for visual disturbance.  Respiratory:  Negative for cough, chest tightness, shortness of breath and wheezing.   Cardiovascular:  Negative for chest pain, palpitations and leg swelling.  Gastrointestinal:  Positive for abdominal pain (occ) and constipation. Negative for abdominal distention, blood in stool, diarrhea, nausea and vomiting.  Genitourinary:  Negative for difficulty urinating and hematuria.  Musculoskeletal:  Negative for arthralgias, myalgias and neck pain.  Skin:  Negative for rash.  Neurological:  Negative for dizziness, seizures, syncope and headaches.  Hematological:  Negative for adenopathy. Does not bruise/bleed easily.  Psychiatric/Behavioral:  Negative for dysphoric mood. The patient is nervous/anxious.    Objective:  BP 122/80   Pulse 82   Temp 98 F (36.7 C) (Temporal)   Ht 6\' 1"  (1.854 m)   Wt 211 lb (95.7 kg)   SpO2 98%   BMI 27.84 kg/m   Wt Readings from Last 3 Encounters:  10/27/22 211 lb (95.7 kg)  05/25/22 218 lb 6.4 oz (99.1 kg)  02/17/22 218 lb (98.9 kg)      Physical Exam Vitals and nursing note reviewed.  Constitutional:      General: He is not in acute distress.    Appearance: Normal appearance. He is well-developed. He is not ill-appearing.  HENT:     Head: Normocephalic and atraumatic.     Right Ear: Hearing, tympanic membrane, ear canal and external ear normal.     Left Ear: Hearing, tympanic membrane, ear canal and external ear normal.     Nose: Nose normal.     Mouth/Throat:     Mouth: Mucous membranes are moist.     Pharynx: Oropharynx is clear. No oropharyngeal exudate or posterior oropharyngeal erythema.  Eyes:     General: No scleral icterus.    Extraocular Movements: Extraocular movements intact.     Conjunctiva/sclera: Conjunctivae normal.     Pupils: Pupils are equal, round,  and reactive to light.  Neck:     Thyroid: No thyroid mass or thyromegaly.  Cardiovascular:     Rate and Rhythm: Normal rate and regular rhythm.     Pulses: Normal pulses.          Radial pulses are 2+ on the right side and 2+ on the left side.     Heart sounds: Normal heart sounds. No murmur heard. Pulmonary:     Effort: Pulmonary effort is normal. No respiratory distress.     Breath sounds: Normal breath sounds. No wheezing, rhonchi or rales.  Abdominal:     General: Bowel sounds are normal. There is no distension.     Palpations: Abdomen is soft. There is no mass.     Tenderness: There is no abdominal tenderness. There is no guarding or rebound.     Hernia: No hernia is present.  Musculoskeletal:        General: Normal range of motion.     Cervical back: Normal range of motion and neck supple.     Right lower leg: No edema.     Left lower leg: No edema.  Lymphadenopathy:     Cervical: No cervical adenopathy.  Skin:    General: Skin is warm and dry.     Findings: No rash.  Neurological:     General: No focal deficit present.     Mental Status: He is alert and oriented to person, place, and time.  Psychiatric:        Mood and Affect: Mood normal.        Behavior: Behavior normal.        Thought Content: Thought content normal.        Judgment: Judgment normal.       Results for orders placed or performed in visit on 03/19/21  Microscopic Examination   Urine  Result Value Ref Range   WBC, UA None seen 0 - 5 /hpf   RBC, Urine None seen 0 - 2 /hpf   Epithelial Cells (non renal) None seen 0 - 10 /hpf   Bacteria, UA None seen None seen/Few  Urinalysis, Complete  Result Value Ref Range   Specific Gravity, UA 1.015 1.005 - 1.030   pH, UA 6.0 5.0 - 7.5   Color, UA Yellow Yellow   Appearance Ur Clear Clear   Leukocytes,UA Negative Negative   Protein,UA Negative Negative/Trace   Glucose, UA Negative Negative   Ketones, UA Negative Negative   RBC, UA Negative Negative    Bilirubin, UA Negative Negative   Urobilinogen, Ur 0.2 0.2 - 1.0 mg/dL   Nitrite, UA Negative Negative   Microscopic Examination See below:       10/27/2022    3:06 PM 02/15/2020    4:07 PM 09/25/2018    4:00 PM 05/25/2017    4:23 PM 09/08/2016  12:45 PM  Depression screen PHQ 2/9  Decreased Interest 0 1 0 1 0  Down, Depressed, Hopeless 1 1 0 2 0  PHQ - 2 Score 1 2 0 3 0  Altered sleeping 0 1  1   Tired, decreased energy 1 1  3    Change in appetite 1 1  0   Feeling bad or failure about yourself  1 1  1    Trouble concentrating 1 1  2    Moving slowly or fidgety/restless 1 1  0   Suicidal thoughts 0 0  0   PHQ-9 Score 6 8  10    Difficult doing work/chores Somewhat difficult          10/27/2022    3:06 PM 02/15/2020    4:07 PM 09/25/2018    4:01 PM 09/08/2016   12:58 PM  GAD 7 : Generalized Anxiety Score  Nervous, Anxious, on Edge 1 2 3 3   Control/stop worrying 1 3 3 3   Worry too much - different things 1 3 3 3   Trouble relaxing 1 3 2 3   Restless 1 2 1 2   Easily annoyed or irritable 2 3 2 2   Afraid - awful might happen 2 2 2 2   Total GAD 7 Score 9 18 16 18   Anxiety Difficulty Somewhat difficult  Somewhat difficult    Assessment & Plan:   Problem List Items Addressed This Visit     Health maintenance examination - Primary (Chronic)    Preventative protocols reviewed and updated unless pt declined. Discussed healthy diet and lifestyle.       GAD (generalized anxiety disorder)    Discussed benzo use, recommend against nightly use. Will restart buspar at 10mg  nightly, encouraged working on other healthy stress relieving strategies.       Relevant Medications   busPIRone (BUSPAR) 10 MG tablet   Ex-smoker    Encouraged full cessation.       Constipation    Continue metamucil crackers PRN, encouraged good fiber and water in diet. Update TSH.       Other Visit Diagnoses     Special screening for malignant neoplasms, colon       Relevant Orders   Ambulatory  referral to Gastroenterology   Need for influenza vaccination       Relevant Orders   Flu vaccine trivalent PF, 6mos and older(Flulaval,Afluria,Fluarix,Fluzone) (Completed)   Fatigue, unspecified type       Relevant Orders   Comprehensive metabolic panel   TSH   CBC with Differential/Platelet   Vitamin B12   Encounter for lipid screening for cardiovascular disease       Relevant Orders   Lipid panel   Immunization due       Relevant Orders   Tdap vaccine greater than or equal to 7yo IM (Completed)        Meds ordered this encounter  Medications   busPIRone (BUSPAR) 10 MG tablet    Sig: Take 1 tablet (10 mg total) by mouth daily.    Dispense:  30 tablet    Refill:  11    Orders Placed This Encounter  Procedures   Flu vaccine trivalent PF, 6mos and older(Flulaval,Afluria,Fluarix,Fluzone)   Tdap vaccine greater than or equal to 7yo IM   Lipid panel   Comprehensive metabolic panel   TSH   CBC with Differential/Platelet   Vitamin B12   Ambulatory referral to Gastroenterology    Referral Priority:   Routine    Referral Type:   Consultation  Referral Reason:   Specialty Services Required    Number of Visits Requested:   1    Patient Instructions  Labs today  Flu shot today  Tdap (tetanus and whooping cough).  Restart buspar 10mg  daily (may take at night time).  Try to limit lorazepam use to as needed.  We will refer you for colonoscopy.  Return as needed or in 1 year for next physical.   Follow up plan: Return in about 1 year (around 10/27/2023), or if symptoms worsen or fail to improve, for annual exam, prior fasting for blood work.  Eustaquio Boyden, MD

## 2022-10-27 NOTE — Assessment & Plan Note (Signed)
Discussed benzo use, recommend against nightly use. Will restart buspar at 10mg  nightly, encouraged working on other healthy stress relieving strategies.

## 2022-10-27 NOTE — Assessment & Plan Note (Signed)
Preventative protocols reviewed and updated unless pt declined. Discussed healthy diet and lifestyle.  

## 2022-10-27 NOTE — Patient Instructions (Addendum)
Labs today  Flu shot today  Tdap (tetanus and whooping cough).  Restart buspar 10mg  daily (may take at night time).  Try to limit lorazepam use to as needed.  We will refer you for colonoscopy.  Return as needed or in 1 year for next physical.

## 2022-10-27 NOTE — Assessment & Plan Note (Deleted)
Chronic, stable period with PRN lorazepam. Refilled today.

## 2022-10-28 ENCOUNTER — Telehealth: Payer: Self-pay

## 2022-10-28 ENCOUNTER — Other Ambulatory Visit: Payer: Self-pay

## 2022-10-28 DIAGNOSIS — Z1211 Encounter for screening for malignant neoplasm of colon: Secondary | ICD-10-CM

## 2022-10-28 LAB — COMPREHENSIVE METABOLIC PANEL
ALT: 20 U/L (ref 0–53)
AST: 17 U/L (ref 0–37)
Albumin: 4.2 g/dL (ref 3.5–5.2)
Alkaline Phosphatase: 70 U/L (ref 39–117)
BUN: 13 mg/dL (ref 6–23)
CO2: 25 mEq/L (ref 19–32)
Calcium: 9.6 mg/dL (ref 8.4–10.5)
Chloride: 105 meq/L (ref 96–112)
Creatinine, Ser: 1.15 mg/dL (ref 0.40–1.50)
GFR: 76.92 mL/min (ref >60.00)
Glucose, Bld: 86 mg/dL (ref 70–99)
Potassium: 4.3 meq/L (ref 3.5–5.1)
Sodium: 139 meq/L (ref 135–145)
Total Bilirubin: 0.5 mg/dL (ref 0.2–1.2)
Total Protein: 6.5 g/dL (ref 6.0–8.3)

## 2022-10-28 LAB — CBC WITH DIFFERENTIAL/PLATELET
Basophils Absolute: 0 10*3/uL (ref 0.0–0.1)
Basophils Relative: 0.9 % (ref 0.0–3.0)
Eosinophils Absolute: 0.6 10*3/uL (ref 0.0–0.7)
Eosinophils Relative: 11.4 % — ABNORMAL HIGH (ref 0.0–5.0)
HCT: 47.7 % (ref 39.0–52.0)
Hemoglobin: 15.9 g/dL (ref 13.0–17.0)
Lymphocytes Relative: 38.3 % (ref 12.0–46.0)
Lymphs Abs: 2 10*3/uL (ref 0.7–4.0)
MCHC: 33.3 g/dL (ref 30.0–36.0)
MCV: 91.5 fl (ref 78.0–100.0)
Monocytes Absolute: 0.6 10*3/uL (ref 0.1–1.0)
Monocytes Relative: 11.8 % (ref 3.0–12.0)
Neutro Abs: 2 10*3/uL (ref 1.4–7.7)
Neutrophils Relative %: 37.6 % — ABNORMAL LOW (ref 43.0–77.0)
Platelets: 224 10*3/uL (ref 150.0–400.0)
RBC: 5.21 Mil/uL (ref 4.22–5.81)
RDW: 13.7 % (ref 11.5–15.5)
WBC: 5.3 10*3/uL (ref 4.0–10.5)

## 2022-10-28 LAB — LIPID PANEL
Cholesterol: 173 mg/dL (ref 0–200)
HDL: 45.5 mg/dL (ref >39.00)
LDL Cholesterol: 106 mg/dL — ABNORMAL HIGH (ref 0–99)
NonHDL: 127.69
Total CHOL/HDL Ratio: 4
Triglycerides: 108 mg/dL (ref 0.0–149.0)
VLDL: 21.6 mg/dL (ref 0.0–40.0)

## 2022-10-28 LAB — VITAMIN B12: Vitamin B-12: 122 pg/mL — ABNORMAL LOW (ref 211–911)

## 2022-10-28 LAB — TSH: TSH: 1.44 u[IU]/mL (ref 0.35–5.50)

## 2022-10-28 MED ORDER — GOLYTELY 236 G PO SOLR
4000.0000 mL | Freq: Once | ORAL | 0 refills | Status: AC
Start: 2022-10-28 — End: 2022-10-28

## 2022-10-28 NOTE — Telephone Encounter (Signed)
Gastroenterology Pre-Procedure Review  Request Date: 12/08/22 Requesting Physician: Dr. Tobi Bastos  PATIENT REVIEW QUESTIONS: The patient responded to the following health history questions as indicated:    1. Are you having any GI issues? no 2. Do you have a personal history of Polyps? no 3. Do you have a family history of Colon Cancer or Polyps? no 4. Diabetes Mellitus? no 5. Joint replacements in the past 12 months?no 6. Major health problems in the past 3 months?no 7. Any artificial heart valves, MVP, or defibrillator?no    MEDICATIONS & ALLERGIES:    Patient reports the following regarding taking any anticoagulation/antiplatelet therapy:   Plavix, Coumadin, Eliquis, Xarelto, Lovenox, Pradaxa, Brilinta, or Effient? no Aspirin? no  Patient confirms/reports the following medications:  Current Outpatient Medications  Medication Sig Dispense Refill   busPIRone (BUSPAR) 10 MG tablet Take 1 tablet (10 mg total) by mouth daily. 30 tablet 11   ibuprofen (ADVIL,MOTRIN) 200 MG tablet Take 200 mg by mouth every 6 (six) hours as needed for pain.     LORazepam (ATIVAN) 0.5 MG tablet TAKE 1 TABLET BY MOUTH TWICE A DAY AS NEEDED 30 tablet 1   Melatonin 5 MG TABS Take 1 tablet by mouth at bedtime.     No current facility-administered medications for this visit.    Patient confirms/reports the following allergies:  No Known Allergies  No orders of the defined types were placed in this encounter.   AUTHORIZATION INFORMATION Primary Insurance: 1D#: Group #:  Secondary Insurance: 1D#: Group #:  SCHEDULE INFORMATION: Date: 12/08/22 Time: Location: [facility]

## 2022-11-02 ENCOUNTER — Other Ambulatory Visit: Payer: Self-pay | Admitting: Family Medicine

## 2022-11-02 DIAGNOSIS — E538 Deficiency of other specified B group vitamins: Secondary | ICD-10-CM

## 2022-11-02 MED ORDER — VITAMIN B-12 1000 MCG PO TABS: 1000.0000 ug | ORAL_TABLET | Freq: Every day | ORAL | Status: AC

## 2022-11-04 ENCOUNTER — Other Ambulatory Visit: Payer: Self-pay | Admitting: Family Medicine

## 2022-11-04 DIAGNOSIS — F411 Generalized anxiety disorder: Secondary | ICD-10-CM

## 2022-11-05 NOTE — Telephone Encounter (Signed)
Name of Medication:  Lorazepam Name of Pharmacy:  [facility] Last Fill or Written Date and Quantity:  10/23/22, #30 Last Office Visit and Type:  10/27/22, CPE Next Office Visit and Type:  none Last Controlled Substance Agreement Date:  none Last UDS:  none

## 2022-11-05 NOTE — Telephone Encounter (Signed)
ERx 

## 2022-11-19 ENCOUNTER — Other Ambulatory Visit: Payer: Self-pay | Admitting: Family Medicine

## 2022-12-01 ENCOUNTER — Encounter: Payer: Self-pay | Admitting: Gastroenterology

## 2022-12-07 ENCOUNTER — Encounter: Payer: Self-pay | Admitting: Gastroenterology

## 2022-12-08 ENCOUNTER — Ambulatory Visit: Payer: 59

## 2022-12-08 ENCOUNTER — Encounter: Payer: Self-pay | Admitting: Gastroenterology

## 2022-12-08 ENCOUNTER — Encounter
Admission: RE | Disposition: A | Discharge status: Home / Self Care | Payer: Self-pay | Source: Home / Self Care | Attending: Gastroenterology

## 2022-12-08 ENCOUNTER — Ambulatory Visit
Admission: RE | Admit: 2022-12-08 | Discharge: 2022-12-08 | Disposition: A | Discharge status: Home / Self Care | Payer: 59 | Attending: Gastroenterology | Admitting: Gastroenterology

## 2022-12-08 DIAGNOSIS — Z1211 Encounter for screening for malignant neoplasm of colon: Secondary | ICD-10-CM

## 2022-12-08 DIAGNOSIS — K635 Polyp of colon: Secondary | ICD-10-CM

## 2022-12-08 DIAGNOSIS — F17210 Nicotine dependence, cigarettes, uncomplicated: Secondary | ICD-10-CM | POA: Diagnosis not present

## 2022-12-08 DIAGNOSIS — D125 Benign neoplasm of sigmoid colon: Secondary | ICD-10-CM

## 2022-12-08 DIAGNOSIS — D120 Benign neoplasm of cecum: Secondary | ICD-10-CM

## 2022-12-08 DIAGNOSIS — D126 Benign neoplasm of colon, unspecified: Secondary | ICD-10-CM

## 2022-12-08 DIAGNOSIS — D122 Benign neoplasm of ascending colon: Secondary | ICD-10-CM

## 2022-12-08 HISTORY — PX: POLYPECTOMY: SHX5525

## 2022-12-08 HISTORY — PX: COLONOSCOPY WITH PROPOFOL: SHX5780

## 2022-12-08 SURGERY — COLONOSCOPY WITH PROPOFOL
Anesthesia: General

## 2022-12-08 MED ORDER — LIDOCAINE HCL (PF) 2 % IJ SOLN
INTRAMUSCULAR | Status: AC
  Filled 2022-12-08: qty 5

## 2022-12-08 MED ORDER — PROPOFOL 500 MG/50ML IV EMUL
INTRAVENOUS | Status: DC | PRN
  Administered 2022-12-08: 200 ug/kg/min via INTRAVENOUS

## 2022-12-08 MED ORDER — LIDOCAINE HCL (PF) 2 % IJ SOLN
INTRAMUSCULAR | Status: DC | PRN
  Administered 2022-12-08: 50 mg via INTRADERMAL

## 2022-12-08 MED ORDER — SODIUM CHLORIDE 0.9 % IV SOLN
INTRAVENOUS | Status: DC
  Administered 2022-12-08: 20 mL/h via INTRAVENOUS

## 2022-12-08 NOTE — Op Note (Signed)
[facility] Gastroenterology Patient Name: Justin Webb Procedure Date: 12/08/2022 10:02 AM MRN: [id number] Account #: [id number] Date of Birth: [birth year] Admit Type: Outpatient Age: 47 Room: [facility] ENDO ROOM 2 Gender: Male Note Status: Finalized Instrument Name: Prentice Docker [id number] Procedure:             Colonoscopy Indications:           Screening for colorectal malignant neoplasm Providers:             Wyline Mood MD, MD Referring MD:          Eustaquio Boyden (Referring MD) Medicines:             Monitored Anesthesia Care Complications:         No immediate complications. Procedure:             Pre-Anesthesia Assessment:                        - Prior to the procedure, a History and Physical was                         performed, and patient medications, allergies and                         sensitivities were reviewed. The patient's tolerance                         of previous anesthesia was reviewed.                        - The risks and benefits of the procedure and the                         sedation options and risks were discussed with the                         patient. All questions were answered and informed                         consent was obtained.                        - ASA Grade Assessment: II - A patient with mild                         systemic disease.                        After obtaining informed consent, the colonoscope was                         passed under direct vision. Throughout the procedure,                         the patient's blood pressure, pulse, and oxygen                         saturations were monitored continuously. The                         Colonoscope was introduced through  the anus and                         advanced to the the cecum, identified by the                         appendiceal orifice. The colonoscopy was performed                         with ease. The patient tolerated the procedure well.                          The quality of the bowel preparation was excellent.                         The ileocecal valve, appendiceal orifice, and rectum                         were photographed. Findings:      The perianal and digital rectal examinations were normal.      Three sessile polyps were found in the sigmoid colon, ascending colon       and cecum. The polyps were 4 to 5 mm in size. These polyps were removed       with a cold snare. Resection and retrieval were complete.      The exam was otherwise without abnormality on direct and retroflexion       views. Impression:            - Three 4 to 5 mm polyps in the sigmoid colon, in the                         ascending colon and in the cecum, removed with a cold                         snare. Resected and retrieved.                        - The examination was otherwise normal on direct and                         retroflexion views. Recommendation:        - Discharge patient to home (with escort).                        - Resume previous diet.                        - Continue present medications.                        - Await pathology results.                        - Repeat colonoscopy for surveillance based on                         pathology results. Procedure Code(s):     --- Professional ---  [address], Colonoscopy, flexible; with removal of                         tumor(s), polyp(s), or other lesion(s) by snare                         technique Diagnosis Code(s):     --- Professional ---                        Z12.11, Encounter for screening for malignant neoplasm                         of colon                        D12.5, Benign neoplasm of sigmoid colon                        D12.2, Benign neoplasm of ascending colon                        D12.0, Benign neoplasm of cecum CPT copyright 2022 American Medical Association. All rights reserved. The codes documented in this report are preliminary and  upon coder review may  be revised to meet current compliance requirements. Wyline Mood, MD Wyline Mood MD, MD 12/08/2022 10:37:16 AM This report has been signed electronically. Number of Addenda: 0 Note Initiated On: 12/08/2022 10:02 AM Scope Withdrawal Time: 0 hours 12 minutes 5 seconds  Total Procedure Duration: 0 hours 16 minutes 21 seconds  Estimated Blood Loss:  Estimated blood loss: none.      [facility]

## 2022-12-08 NOTE — Anesthesia Postprocedure Evaluation (Signed)
Anesthesia Post Note  Patient: Justin Webb  Procedure(s) Performed: COLONOSCOPY WITH PROPOFOL POLYPECTOMY  Patient location during evaluation: Endoscopy Anesthesia Type: General Level of consciousness: awake and alert Pain management: pain level controlled Vital Signs Assessment: post-procedure vital signs reviewed and stable Respiratory status: spontaneous breathing, nonlabored ventilation, respiratory function stable and patient connected to nasal cannula oxygen Cardiovascular status: blood pressure returned to baseline and stable Postop Assessment: no apparent nausea or vomiting Anesthetic complications: no   There were no known notable events for this encounter.   Last Vitals:  Vitals:   12/08/22 1048 12/08/22 1058  BP:    Pulse: 69 (!) 56  Resp:    Temp:    SpO2: 99% 100%    Last Pain:  Vitals:   12/08/22 1058  TempSrc:   PainSc: 0-No pain                 Lenard Simmer

## 2022-12-08 NOTE — Transfer of Care (Signed)
Immediate Anesthesia Transfer of Care Note  Patient: Justin Webb  Procedure(s) Performed: COLONOSCOPY WITH PROPOFOL POLYPECTOMY  Patient Location: PACU  Anesthesia Type:General  Level of Consciousness: drowsy  Airway & Oxygen Therapy: Patient Spontanous Breathing  Post-op Assessment: Report given to RN and Post -op Vital signs reviewed and stable  Post vital signs: Reviewed and stable  Last Vitals:  Vitals Value Taken Time  BP 106/61 12/08/22 1037  Temp    Pulse 72 12/08/22 1038  Resp 16 12/08/22 1038  SpO2 98 % 12/08/22 1038  Vitals shown include unfiled device data.  Last Pain:  Vitals:   12/08/22 0941  TempSrc: Temporal  PainSc: 0-No pain         Complications: There were no known notable events for this encounter.

## 2022-12-08 NOTE — H&P (Signed)
Wyline Mood, MD [address], [address], [place], [place], [address] [address], [place], [place], [address] Phone: [phone]  Fax: [phone]  Primary Care Physician:  Eustaquio Boyden, MD   Pre-Procedure History & Physical: HPI:  Justin Webb is a 47 y.o. male is here for an colonoscopy.   Past Medical History:  Diagnosis Date   Anxiety    Childhood asthma    Ex-smoker quit 10/2012   Kidney stone 12/2011   R   Pancreatitis    Testicular torsion 1995   left    Past Surgical History:  Procedure Laterality Date   exercise treadmill  10/2012   WNL, excellent exercise tolerance   EXTRACORPOREAL SHOCK WAVE LITHOTRIPSY Right 02/26/2021   Procedure: EXTRACORPOREAL SHOCK WAVE LITHOTRIPSY (ESWL);  Surgeon: Riki Altes, MD;  Location: [facility] ORS;  Service: Urology;  Laterality: Right;   testicular torsion  1995   left    Prior to Admission medications   Medication Sig Start Date End Date Taking? Authorizing Provider  busPIRone (BUSPAR) 10 MG tablet TAKE 1 TABLET BY MOUTH EVERY DAY 11/19/22  Yes Eustaquio Boyden, MD  cyanocobalamin (VITAMIN B12) 1000 MCG tablet Take 1 tablet (1,000 mcg total) by mouth daily. 11/02/22  Yes Eustaquio Boyden, MD  ibuprofen (ADVIL,MOTRIN) 200 MG tablet Take 200 mg by mouth every 6 (six) hours as needed for pain.   Yes [provider]  LORazepam (ATIVAN) 0.5 MG tablet TAKE 1 TABLET BY MOUTH TWICE A DAY AS NEEDED 11/05/22  Yes Eustaquio Boyden, MD  Melatonin 5 MG TABS Take 1 tablet by mouth at bedtime.   Yes [provider]    Allergies as of 10/29/2022   (No Known Allergies)    Family History  Problem Relation Age of Onset   Healthy Father    Heart attack Mother        slight; + smoker; Mid 86's   Diabetes Maternal Grandmother    Other Paternal Grandfather        Brain tumor    Social History   Socioeconomic History   Marital status: Married    Spouse name: Not on file   Number of  children: Not on file   Years of education: Not on file   Highest education level: Not on file  Occupational History   Occupation: Financial planner  Tobacco Use   Smoking status: Some Days    Current packs/day: 0.25    Average packs/day: 0.3 packs/day for 10.0 years (2.5 ttl pk-yrs)    Types: Cigarettes   Smokeless tobacco: Never  Vaping Use   Vaping status: Never Used  Substance and Sexual Activity   Alcohol use: No    Alcohol/week: 0.0 standard drinks of alcohol    Comment: Rare   Drug use: No   Sexual activity: Not on file  Other Topics Concern   Not on file  Social History Narrative   Gator fan   3-4 sodas/day; 1-2 cups sweet tea/day   Lives with wife; no pets   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; Tree surgeon   Edu: Some college; Arts administrator   Act: Walks occasionally   Diet: Eats salads; some fruits and vegetables   Social Determinants of Corporate investment banker Strain: Not on file  Food Insecurity: Not on file  Transportation Needs: Not on file  Physical Activity: Not on file  Stress: Not on file  Social Connections: Not on file  Intimate Partner Violence: Not on file  Review of Systems: See HPI, otherwise negative ROS  Physical Exam: BP (!) 127/93   Pulse 74   Temp (!) 96.7 F (35.9 C) (Temporal)   Resp 20   Ht 6\' 1"  (1.854 m)   Wt 95 kg   SpO2 99%   BMI 27.63 kg/m  General:   Alert,  Corredor and cooperative in NAD Head:  Normocephalic and atraumatic. Neck:  Supple; no masses or thyromegaly. Lungs:  Clear throughout to auscultation, normal respiratory effort.    Heart:  +S1, +S2, Regular rate and rhythm, No edema. Abdomen:  Soft, nontender and nondistended. Normal bowel sounds, without guarding, and without rebound.   Neurologic:  Alert and  oriented x4;  grossly normal neurologically.  Impression/Plan: Justin Webb is here for an colonoscopy to be performed for Screening colonoscopy average risk   Risks, benefits, limitations,  and alternatives regarding  colonoscopy have been reviewed with the patient.  Questions have been answered.  All parties agreeable.   Wyline Mood, MD  12/08/2022, 9:51 AM

## 2022-12-08 NOTE — Anesthesia Preprocedure Evaluation (Signed)
Anesthesia Evaluation  Patient identified by MRN, date of birth, ID band Patient awake    Reviewed: Allergy & Precautions, H&P , NPO status , Patient's Chart, lab work & pertinent test results, reviewed documented beta blocker date and time   History of Anesthesia Complications Negative for: history of anesthetic complications  Airway Mallampati: II  TM Distance: >3 FB Neck ROM: full    Dental  (+) Dental Advidsory Given, Caps, Teeth Intact, Missing   Pulmonary neg shortness of breath, asthma (as a child) , Continuous Positive Airway Pressure Ventilation neg sleep apnea, neg COPD, neg recent URI, Current Smoker   Pulmonary exam normal breath sounds clear to auscultation       Cardiovascular Exercise Tolerance: Good negative cardio ROS Normal cardiovascular exam Rhythm:regular Rate:Normal     Neuro/Psych negative neurological ROS  negative psych ROS   GI/Hepatic negative GI ROS, Neg liver ROS,,,  Endo/Other  negative endocrine ROS    Renal/GU Renal disease (kidney stones)  negative genitourinary   Musculoskeletal   Abdominal   Peds  Hematology negative hematology ROS (+)   Anesthesia Other Findings Past Medical History: No date: Anxiety No date: Childhood asthma quit 10/2012: Ex-smoker 12/2011: Kidney stone     Comment:  R No date: Pancreatitis 1995: Testicular torsion     Comment:  left   Reproductive/Obstetrics negative OB ROS                             Anesthesia Physical Anesthesia Plan  ASA: 2  Anesthesia Plan: General   Post-op Pain Management:    Induction: Intravenous  PONV Risk Score and Plan: 1 and Propofol infusion and TIVA  Airway Management Planned: Natural Airway and Nasal Cannula  Additional Equipment:   Intra-op Plan:   Post-operative Plan:   Informed Consent: I have reviewed the patients History and Physical, chart, labs and discussed the procedure  including the risks, benefits and alternatives for the proposed anesthesia with the patient or authorized representative who has indicated his/her understanding and acceptance.     Dental Advisory Given  Plan Discussed with: Anesthesiologist, CRNA and Surgeon  Anesthesia Plan Comments:        Anesthesia Quick Evaluation

## 2022-12-09 ENCOUNTER — Encounter: Payer: Self-pay | Admitting: Gastroenterology

## 2022-12-09 LAB — SURGICAL PATHOLOGY

## 2022-12-12 ENCOUNTER — Encounter: Payer: Self-pay | Admitting: Gastroenterology

## 2023-01-03 ENCOUNTER — Other Ambulatory Visit: Payer: Self-pay | Admitting: Family Medicine

## 2023-01-03 DIAGNOSIS — F411 Generalized anxiety disorder: Secondary | ICD-10-CM

## 2023-01-03 NOTE — Telephone Encounter (Signed)
Refill request for LORAZEPAM 0.5 MG TABLET   LOV - 10/27/22 Next OV - not scheduled Last refill - 11/05/22

## 2023-01-04 NOTE — Telephone Encounter (Signed)
ERx 

## 2023-01-11 ENCOUNTER — Encounter: Payer: Self-pay | Admitting: Family Medicine

## 2023-01-11 ENCOUNTER — Ambulatory Visit: Payer: 59 | Admitting: Family Medicine

## 2023-01-11 VITALS — BP 118/60 | HR 95 | Temp 98.6°F | Ht 73.0 in | Wt 217.0 lb

## 2023-01-11 DIAGNOSIS — H04123 Dry eye syndrome of bilateral lacrimal glands: Secondary | ICD-10-CM

## 2023-01-11 DIAGNOSIS — H539 Unspecified visual disturbance: Secondary | ICD-10-CM | POA: Diagnosis not present

## 2023-01-11 DIAGNOSIS — H1033 Unspecified acute conjunctivitis, bilateral: Secondary | ICD-10-CM | POA: Diagnosis not present

## 2023-01-11 DIAGNOSIS — E538 Deficiency of other specified B group vitamins: Secondary | ICD-10-CM | POA: Diagnosis not present

## 2023-01-11 MED ORDER — CYANOCOBALAMIN 1000 MCG/ML IJ SOLN
1000.0000 ug | Freq: Once | INTRAMUSCULAR | Status: AC
Start: 2023-01-11 — End: 2023-01-11
  Administered 2023-01-11: 1000 ug via INTRAMUSCULAR

## 2023-01-11 NOTE — Progress Notes (Signed)
Ph: [phone] Fax: [phone]   Patient ID: Justin Webb, male    DOB: January 23, 1976, 47 y.o.   MRN: [id number]  This visit was conducted in person.  BP 118/60   Pulse 95   Temp 98.6 F (37 C) (Oral)   Ht 6\' 1"  (1.854 m)   Wt 217 lb (98.4 kg)   SpO2 98%   BMI 28.63 kg/m    Chief Complaint  Patient presents with   Dry Eye    Blood shot and dry otc drops help for little bit but not much. Started about 2 weeks ago.     Vision Screening   Right eye Left eye Both eyes  Without correction 20/40 20/40 20/25   With correction       Subjective:   Discussed the use of AI scribe software for clinical note transcription with the patient, who gave verbal consent to proceed.  History of Present Illness   The patient is a 47 year old male who presents with a two-week history of dry and red eyes. The symptoms are more pronounced in the right eye, particularly on the inner nasal side. He reports using various eye drops, including Clear Eyes Redness Relief, and Lumify, for symptom relief. active ingredients of these are Naphazoline and Brimonidine both alpha agonists. While these drops provide temporary relief, it does not alleviate the redness. The patient denies any recent changes in vision, drainage from the eyes, or other allergy symptoms such as nose congestion, runny nose, sore throat, or ear pain. He also denies any recent changes in medication, vitamins, or supplements. The patient has a history of low B12 levels and neuropathy, and is currently taking Buspar as needed, lorazepam as needed, and melatonin for sleep. He denies fever, new rash or joint pains.          Relevant past medical, surgical, family and social history reviewed and updated as indicated. Interim medical history since our last visit reviewed. Allergies and medications reviewed and updated. Outpatient Medications Prior to Visit  Medication Sig Dispense Refill   busPIRone (BUSPAR) 10 MG tablet TAKE 1 TABLET  BY MOUTH EVERY DAY 90 tablet 3   ibuprofen (ADVIL,MOTRIN) 200 MG tablet Take 200 mg by mouth every 6 (six) hours as needed for pain.     LORazepam (ATIVAN) 0.5 MG tablet TAKE 1 TABLET BY MOUTH TWICE A DAY AS NEEDED 30 tablet 1   Melatonin 5 MG TABS Take 1 tablet by mouth at bedtime.     cyanocobalamin (VITAMIN B12) 1000 MCG tablet Take 1 tablet (1,000 mcg total) by mouth daily. (Patient not taking: Reported on 01/11/2023)     No facility-administered medications prior to visit.     Per HPI unless specifically indicated in ROS section below Review of Systems  Objective:  BP 118/60   Pulse 95   Temp 98.6 F (37 C) (Oral)   Ht 6\' 1"  (1.854 m)   Wt 217 lb (98.4 kg)   SpO2 98%   BMI 28.63 kg/m   Wt Readings from Last 3 Encounters:  01/11/23 217 lb (98.4 kg)  12/08/22 209 lb 6.4 oz (95 kg)  10/27/22 211 lb (95.7 kg)      Physical Exam           Physical Exam Vitals and nursing note reviewed.  Constitutional:      Appearance: Normal appearance. He is not ill-appearing.  HENT:     Head: Normocephalic and atraumatic.     Nose: Nose normal. No congestion  or rhinorrhea.     Mouth/Throat:     Mouth: Mucous membranes are moist.     Pharynx: Oropharynx is clear. No oropharyngeal exudate or posterior oropharyngeal erythema.  Eyes:     General: Lids are normal.        Right eye: No foreign body or discharge.        Left eye: No foreign body or discharge.     Extraocular Movements: Extraocular movements intact.     Right eye: Normal extraocular motion.     Left eye: Normal extraocular motion.     Conjunctiva/sclera:     Right eye: Right conjunctiva is injected. No exudate.    Left eye: Left conjunctiva is injected. No exudate.    Pupils: Pupils are equal, round, and reactive to light.     Comments:  Bilateral bulbar > palpebral conjunctival injection with mostly limbic sparing  Limited fundoscopic exam without obvious papilledema.   Neurological:     Mental Status: He is  alert.        Lab Results  Component Value Date   VITAMINB12 122 (L) 10/27/2022   Assessment & Plan:      Dry Eye Syndrome   Bilateral dry eyes with redness, burning, and itching. More severe on the right side. No vision changes, discharge, or foreign body sensation. He does have mildly decreased visual acuity on independent eye testing bilaterally. Current use of Clear Eyes and Lumify not providing sustained relief.   -Discontinue Clear Eyes and Lumify due to his decongestant properties.   -Start lubricating eye drops (e.g., Refresh or Hypo Tears) as needed for dryness relief.   -Refer to ophthalmology for further evaluation and management.    Vitamin B12 Deficiency   Previous low B12 levels associated with fatigue and neuropathy. Has not taken oral B12 supplementation.   -Administer B12 injection today.   -Resume daily oral B12 supplementation.   -Check B12 levels at next blood work.    Vision Changes   Reports changes in vision over the past few years. Last eye exam was in 2014-2015.   -Perform vision screening today.   -Refer to ophthalmology for comprehensive eye exam.      Problem List Items Addressed This Visit     Vitamin B12 deficiency    Has not been taking - will provide B12 shot today x1 then start oral supplementation daily.       Other Visit Diagnoses     Acute conjunctivitis of both eyes, unspecified acute conjunctivitis type    -  Primary   Relevant Orders   Ambulatory referral to Ophthalmology   Dry eye syndrome of both eyes       Relevant Orders   Ambulatory referral to Ophthalmology   Vision changes       Relevant Orders   Ambulatory referral to Ophthalmology        Meds ordered this encounter  Medications   cyanocobalamin (VITAMIN B12) injection 1,000 mcg    Orders Placed This Encounter  Procedures   Ambulatory referral to Ophthalmology    Referral Priority:   Routine    Referral Type:   Consultation    Referral Reason:    Specialty Services Required    Requested Specialty:   Ophthalmology    Number of Visits Requested:   1    Patient Instructions  VISIT SUMMARY: Today, we discussed your recent symptoms of dry and red eyes, particularly in your right eye, and your history of low B12 levels. We  also addressed your vision changes over the past few years.  YOUR PLAN: -DRY EYE SYNDROME: Dry Eye Syndrome occurs when your eyes do not produce enough tears or the right quality of tears to keep your eyes comfortable. We recommend stopping the use of Clear Eyes and Lumify as they contain decongestants that can worsen dryness. Instead, start using lubricating eye drops like Refresh or Hypo Tears as needed. We are also referring you to an eye specialist for further evaluation.  -VITAMIN B12 DEFICIENCY: Vitamin B12 Deficiency can lead to fatigue and nerve problems. You received a B12 injection today and should resume taking daily oral B12 supplements. We will check your B12 levels during your next blood work.  -VISION CHANGES: You mentioned changes in your vision over the past few years, and your last eye exam was several years ago. We performed a vision screening today and are referring you to an eye specialist for a comprehensive eye exam.  INSTRUCTIONS: Please follow up with the ophthalmologist as soon as possible for your dry eyes and vision changes. Continue taking your daily oral B12 supplements and we will recheck your B12 levels at your next blood work.  Follow up plan: No follow-ups on file.  Eustaquio Boyden, MD

## 2023-01-11 NOTE — Assessment & Plan Note (Addendum)
Has not been taking - will provide B12 shot today x1 then start oral supplementation daily.

## 2023-01-11 NOTE — Patient Instructions (Addendum)
VISIT SUMMARY: Today, we discussed your recent symptoms of dry and red eyes, particularly in your right eye, and your history of low B12 levels. We also addressed your vision changes over the past few years.  YOUR PLAN: -DRY EYE SYNDROME: Dry Eye Syndrome occurs when your eyes do not produce enough tears or the right quality of tears to keep your eyes comfortable. We recommend stopping the use of Clear Eyes and Lumify as they contain decongestants that can worsen dryness. Instead, start using lubricating eye drops like Refresh or Hypo Tears as needed. We are also referring you to an eye specialist for further evaluation.  -VITAMIN B12 DEFICIENCY: Vitamin B12 Deficiency can lead to fatigue and nerve problems. You received a B12 injection today and should resume taking daily oral B12 supplements. We will check your B12 levels during your next blood work.  -VISION CHANGES: You mentioned changes in your vision over the past few years, and your last eye exam was several years ago. We performed a vision screening today and are referring you to an eye specialist for a comprehensive eye exam.  INSTRUCTIONS: Please follow up with the ophthalmologist as soon as possible for your dry eyes and vision changes. Continue taking your daily oral B12 supplements and we will recheck your B12 levels at your next blood work.

## 2023-03-07 ENCOUNTER — Other Ambulatory Visit: Payer: Self-pay | Admitting: Family Medicine

## 2023-03-07 DIAGNOSIS — F411 Generalized anxiety disorder: Secondary | ICD-10-CM

## 2023-03-07 NOTE — Telephone Encounter (Signed)
 Refill request for LORAZEPAM 0.5 MG TABLET   LOV - 01/11/23 Next OV - not scheduled Last refill - 01/04/23 #30/1

## 2023-03-08 NOTE — Telephone Encounter (Signed)
 ERx

## 2023-04-22 ENCOUNTER — Encounter: Payer: Self-pay | Admitting: Family Medicine

## 2023-04-22 DIAGNOSIS — H40009 Preglaucoma, unspecified, unspecified eye: Secondary | ICD-10-CM | POA: Insufficient documentation

## 2023-05-09 ENCOUNTER — Other Ambulatory Visit: Payer: Self-pay | Admitting: Family Medicine

## 2023-05-09 DIAGNOSIS — F411 Generalized anxiety disorder: Secondary | ICD-10-CM

## 2023-05-09 NOTE — Telephone Encounter (Signed)
 ERx

## 2023-05-09 NOTE — Telephone Encounter (Signed)
 Name of Medication:  Lorazepam Name of Pharmacy:  [facility] Last Fill or Written Date and Quantity:  03/31/23, #30 Last Office Visit and Type:  01/11/23, acute conjunctivitis  Next Office Visit and Type:  none Last Controlled Substance Agreement Date:  none Last UDS:  none

## 2023-06-28 ENCOUNTER — Ambulatory Visit: Admitting: Internal Medicine

## 2023-06-28 ENCOUNTER — Encounter: Payer: Self-pay | Admitting: Internal Medicine

## 2023-06-28 VITALS — BP 104/76 | HR 80 | Temp 98.2°F | Ht 73.0 in | Wt 217.0 lb

## 2023-06-28 DIAGNOSIS — K5901 Slow transit constipation: Secondary | ICD-10-CM

## 2023-06-28 NOTE — Progress Notes (Signed)
 Subjective:    Patient ID: Justin Webb, male    DOB: Jun 17, 1975, 48 y.o.   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 Tries to walk 2 miles a day Trying to eat better--less fried food  Feels soreness throughout abdomen--like he did a hard core workout Some RLQ discomfort and around to back  Has tried metamucil cracker every other day--no help Benefiber in water--3-4 days and no result Did take enema before Easter---some evacuation but limited Oral laxative--not much help (dulcolax)--only went a little  Current Outpatient Medications on File Prior to Visit  Medication Sig Dispense Refill   busPIRone  (BUSPAR ) 10 MG tablet TAKE 1 TABLET BY MOUTH EVERY DAY 90 tablet 3   cyanocobalamin  (VITAMIN B12) 1000 MCG tablet Take 1 tablet (1,000 mcg total) by mouth daily.     ibuprofen (ADVIL,MOTRIN) 200 MG tablet Take 200 mg by mouth every 6 (six) hours as needed for pain.     LORazepam  (ATIVAN ) 0.5 MG tablet TAKE 1 TABLET BY MOUTH TWICE A DAY AS NEEDED 30 tablet 1   Melatonin 5 MG TABS Take 1 tablet by mouth at bedtime.     No current facility-administered medications on file prior to visit.    No Known Allergies  Past Medical History:  Diagnosis Date   Anxiety    Childhood asthma    Ex-smoker quit 10/2012   Kidney stone 12/2011   R   Pancreatitis    Testicular torsion 1995   left    Past Surgical History:  Procedure Laterality Date   COLONOSCOPY WITH PROPOFOL  N/A 12/08/2022   TA x2, rpt 5 yrs Antony Baumgartner, Lenton Rail, MD)   exercise treadmill  10/30/2012   WNL, excellent exercise tolerance   EXTRACORPOREAL SHOCK WAVE  LITHOTRIPSY Right 02/26/2021   Procedure: EXTRACORPOREAL SHOCK WAVE LITHOTRIPSY (ESWL);  Surgeon: Geraline Knapp, MD;  Location: [facility] ORS;  Service: Urology;  Laterality: Right;   POLYPECTOMY  12/08/2022   Procedure: POLYPECTOMY;  Surgeon: Luke Salaam, MD;  Location: [facility] END[facility];  Service: Gastroenterology;;   testicular torsion  03/01/1993   left    Family History  Problem Relation Age of Onset   Healthy Father    Heart attack Mother        slight; + smoker; Mid 87's   Diabetes Maternal Grandmother    Other Paternal Grandfather        Brain tumor    Social History   Socioeconomic History   Marital status: Married    Spouse name: Not on file   Number of children: Not on file   Years of education: Not on file   Highest education level: Not on file  Occupational History   Occupation: Financial planner  Tobacco Use   Smoking status: Some Days    Current packs/day: 0.25    Average packs/day: 0.3 packs/day for 10.0 years (2.5 ttl pk-yrs)    Types: Cigarettes   Smokeless tobacco: Never  Vaping Use   Vaping status: Never Used  Substance and Sexual Activity   Alcohol use: No    Alcohol/week: 0.0 standard drinks of alcohol    Comment: Rare   Drug use:  No   Sexual activity: Not on file  Other Topics Concern   Not on file  Social History Narrative   Gator fan   3-4 sodas/day; 1-2 cups sweet tea/day   Lives with wife; no pets   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  Pulmonary:     Effort: Pulmonary effort is normal.     Breath sounds: Normal breath sounds. No wheezing or rales.  Abdominal:     General: There is no distension.     Palpations: Abdomen is soft.     Tenderness: There is no abdominal tenderness. There is no guarding or rebound.  Neurological:     Mental Status: He is alert.            Assessment & Plan:

## 2023-06-28 NOTE — Assessment & Plan Note (Signed)
 Discussed trying miralax regularly He wants action today---recommended every 2 hours and senna-s 2 bid If no success after 4-5 doses--can try oral dulcolax again Probably should continue the miralax daily or as best controls his stools after he clears out

## 2023-06-28 NOTE — Patient Instructions (Signed)
 Please take a full capful of miralax in a glass of water every 1-2 hours today. You can put it in apple juice or prune juice for an added kick. You can also try senna-S ---two tabs twice a day. If you have had 4-5 doses of the miralax and haven't had success---you can try another dulcolax pill.  Once you are clear, you can continue daily miralax &/or senna s---so that you have regular habits.

## 2023-07-08 ENCOUNTER — Other Ambulatory Visit: Payer: Self-pay | Admitting: Family Medicine

## 2023-07-08 DIAGNOSIS — F411 Generalized anxiety disorder: Secondary | ICD-10-CM

## 2023-07-08 NOTE — Telephone Encounter (Signed)
 Name of Medication:  Lorazepam  Name of Pharmacy:  [facility] Last Fill or Written Date and Quantity:  06/07/23, #30 Last Office Visit and Type:  01/11/23, acute conjunctivitis  Next Office Visit and Type:  none Last Controlled Substance Agreement Date:  none Last UDS:  none

## 2023-07-08 NOTE — Telephone Encounter (Signed)
 Will forward to Dr Vallarie Gauze to see if he is willing to fill this controlled substance in my absence

## 2023-07-10 NOTE — Telephone Encounter (Signed)
 Sent. Thanks.

## 2023-07-11 NOTE — Telephone Encounter (Signed)
 Noted.

## 2023-07-27 ENCOUNTER — Ambulatory Visit: Payer: Self-pay

## 2023-07-27 NOTE — Telephone Encounter (Signed)
 Copied from CRM [id number]. Topic: Clinical - Red Word Triage >> Jul 27, 2023 10:03 AM Varney Gentleman wrote: Red Word that prompted transfer to Nurse Triage: Patient was in on 4/29 for stomach issues now having pain on right side stomach area.  Chief Complaint: Right lower back pain Symptoms: Digestive system "issues", dull ache Frequency: A couple of weeks, worsening gradually Pertinent Negatives: Patient denies fever Disposition: [] ED /[] Urgent Care (no appt availability in office) / [x] Appointment(In office/virtual)/ []  [place] [facility]/ [] Home Care/ [] Refused Recommended Disposition /[] [facility]/ []  Follow-up with PCP Additional Notes: Patient called in to report right lower back pain. Patient stated pain has been present for a couple of weeks, but has been gradually worsening. Patient described the pain as a "dull ache". Patient denied fever, abdominal pain, hematuria and painful urination. Advised patient to be seen within 3 days, per protocol. No availability with PCP. Scheduled with alternate provider in office. Provided care advice and instructed patient to call back if symptoms worsen. Patient complied.   Reason for Disposition  [1] MODERATE back pain (e.g., interferes with normal activities) AND [2] present > 3 days  Answer Assessment - Initial Assessment Questions 1. ONSET: "When did the pain begin?"      A couple of weeks, has worsened recently  2. LOCATION: "Where does it hurt?" (upper, mid or lower back)     Right lower back 3. SEVERITY: "How bad is the pain?"  (e.g., Scale 1-10; mild, moderate, or severe)   - MILD (1-3): Doesn't interfere with normal activities.    - MODERATE (4-7): Interferes with normal activities or awakens from sleep.    - SEVERE (8-10): Excruciating pain, unable to do any normal activities.      "Not excruciating", dull pain that comes and goes 4. PATTERN: "Is the pain constant?" (e.g., yes, no; constant, intermittent)      Comes and  goes 5. RADIATION: "Does the pain shoot into your legs or somewhere else?"     Denies 6. CAUSE:  "What do you think is causing the back pain?"      Unknown 7. BACK OVERUSE:  "Any recent lifting of heavy objects, strenuous work or exercise?"     Denies 8. MEDICINES: "What have you taken so far for the pain?" (e.g., nothing, acetaminophen , NSAIDS)     Ibuprofen occasionally 9. NEUROLOGIC SYMPTOMS: "Do you have any weakness, numbness, or problems with bowel/bladder control?"     Denies weakness, denies numbness, states digestive track "seems off" 10. OTHER SYMPTOMS: "Do you have any other symptoms?" (e.g., fever, abdomen pain, burning with urination, blood in urine)     Denies fever, denies abdominal pain, denies hematuria, denies burning while using restroom History of kidney stones  Protocols used: Back Pain-A-AH

## 2023-07-27 NOTE — Telephone Encounter (Signed)
Noted, will see

## 2023-07-27 NOTE — Telephone Encounter (Signed)
 Appointment with Dr. Cherlyn Cornet 06/28/2023 at 4:00 pm.

## 2023-07-28 ENCOUNTER — Ambulatory Visit (INDEPENDENT_AMBULATORY_CARE_PROVIDER_SITE_OTHER): Admitting: Family Medicine

## 2023-07-28 ENCOUNTER — Encounter: Payer: Self-pay | Admitting: Family Medicine

## 2023-07-28 VITALS — BP 114/78 | HR 76 | Temp 98.3°F | Ht 73.0 in | Wt 218.0 lb

## 2023-07-28 DIAGNOSIS — R109 Unspecified abdominal pain: Secondary | ICD-10-CM | POA: Diagnosis not present

## 2023-07-28 LAB — POC URINALSYSI DIPSTICK (AUTOMATED)
Bilirubin, UA: NEGATIVE
Blood, UA: NEGATIVE
Glucose, UA: NEGATIVE
Ketones, UA: NEGATIVE
Leukocytes, UA: NEGATIVE
Nitrite, UA: NEGATIVE
Protein, UA: NEGATIVE
Spec Grav, UA: 1.01 (ref 1.010–1.025)
Urobilinogen, UA: 0.2 U/dL
pH, UA: 7 (ref 5.0–8.0)

## 2023-07-28 NOTE — Patient Instructions (Signed)
 Start diclofenac 75 mg twice daily  for possible back pain. Start heat and gentle low back stretching. Keep up with water intake.  Return tommorow or early next week  for KUB X-ray.

## 2023-07-28 NOTE — Progress Notes (Signed)
 Patient ID: Justin Webb, male    DOB: 20-Jan-1976, 48 y.o.   MRN: [id number]  This visit was conducted in person.  BP 114/78 (BP Location: Left Arm, Patient Position: Sitting, Cuff Size: Normal)   Pulse 76   Temp 98.3 F (36.8 C) (Oral)   Ht 6\' 1"  (1.854 m)   Wt 218 lb (98.9 kg)   SpO2 97%   BMI 28.76 kg/m    CC:  Chief Complaint  Patient presents with   Back Pain    Right lower back pain since about easter; no fall or injury. Patient takes ibuprofen as needed; has not used any ice or heat on area. Does have a history of kidney stones and starting to have some off and on digestive issues.     Subjective:   HPI: Justin Webb is a 48 y.o. male pt of Dr. Crissie Dome presenting on 07/28/2023 for Back Pain (Right lower back pain since about easter; no fall or injury. Patient takes ibuprofen as needed; has not used any ice or heat on area. Does have a history of kidney stones and starting to have some off and on digestive issues. )  New onset right low back pain x 1 month, no known fall or injury. No change in activity.  Occ pain in right anterior abdominal pain.   More constipation in last month.. Dr. Joelle Musca recommended mirlax daily ... This seemed to help. No fever, no N/V.  Has been having daily bowel movements.   No frequency, urgency, no dysuria.  No blood in stool or urine.  Pain is dull ache.   Using ibuprofen 600 mg  prn.. .. Helps temporarily  Hx of kidney stones     Relevant past medical, surgical, family and social history reviewed and updated as indicated. Interim medical history since our last visit reviewed. Allergies and medications reviewed and updated. Outpatient Medications Prior to Visit  Medication Sig Dispense Refill   busPIRone  (BUSPAR ) 10 MG tablet TAKE 1 TABLET BY MOUTH EVERY DAY 90 tablet 3   cyanocobalamin  (VITAMIN B12) 1000 MCG tablet Take 1 tablet (1,000 mcg total) by mouth daily.     ibuprofen (ADVIL,MOTRIN) 200 MG tablet Take 200 mg by  mouth every 6 (six) hours as needed for pain.     LORazepam  (ATIVAN ) 0.5 MG tablet TAKE 1 TABLET BY MOUTH TWICE A DAY AS NEEDED 30 tablet 0   Melatonin 5 MG TABS Take 1 tablet by mouth at bedtime.     No facility-administered medications prior to visit.     Per HPI unless specifically indicated in ROS section below Review of Systems  Constitutional:  Negative for fatigue and fever.  HENT:  Negative for ear pain.   Eyes:  Negative for pain.  Respiratory:  Negative for cough and shortness of breath.   Cardiovascular:  Negative for chest pain, palpitations and leg swelling.  Gastrointestinal:  Negative for abdominal pain.  Genitourinary:  Negative for dysuria.  Musculoskeletal:  Negative for arthralgias.  Neurological:  Negative for syncope, light-headedness and headaches.  Psychiatric/Behavioral:  Negative for dysphoric mood.    Objective:  BP 114/78 (BP Location: Left Arm, Patient Position: Sitting, Cuff Size: Normal)   Pulse 76   Temp 98.3 F (36.8 C) (Oral)   Ht 6\' 1"  (1.854 m)   Wt 218 lb (98.9 kg)   SpO2 97%   BMI 28.76 kg/m   Wt Readings from Last 3 Encounters:  07/28/23 218 lb (98.9 kg)  06/28/23 217  lb (98.4 kg)  01/11/23 217 lb (98.4 kg)      Physical Exam Vitals reviewed.  Constitutional:      Appearance: He is well-developed.  HENT:     Head: Normocephalic.     Right Ear: Hearing normal.     Left Ear: Hearing normal.     Nose: Nose normal.  Neck:     Thyroid : No thyroid  mass or thyromegaly.     Vascular: No carotid bruit.     Trachea: Trachea normal.  Cardiovascular:     Rate and Rhythm: Normal rate and regular rhythm.     Pulses: Normal pulses.     Heart sounds: Heart sounds not distant. No murmur heard.    No friction rub. No gallop.     Comments: No peripheral edema Pulmonary:     Effort: Pulmonary effort is normal. No respiratory distress.     Breath sounds: Normal breath sounds.  Abdominal:     General: Abdomen is flat.     Palpations: Abdomen  is soft. There is no hepatomegaly, splenomegaly or mass.     Tenderness: There is no right CVA tenderness, guarding or rebound. Negative signs include Murphy's sign.  Musculoskeletal:     Thoracic back: Normal.     Lumbar back: Normal. No tenderness or bony tenderness. Normal range of motion. Negative right straight leg raise test and negative left straight leg raise test.  Skin:    General: Skin is warm and dry.     Findings: No rash.  Psychiatric:        Speech: Speech normal.        Behavior: Behavior normal.        Thought Content: Thought content normal.       Results for orders placed or performed during the hospital encounter of 12/08/22  Surgical pathology   Collection Time: 12/08/22 12:00 AM  Result Value Ref Range   SURGICAL PATHOLOGY      SURGICAL PATHOLOGY [facility] [address], [address] [place], [place] [address] Telephone [phone] or [phone] Fax [phone]  REPORT OF SURGICAL PATHOLOGY   Accession #: [id number] Patient Name: Justin Webb, Justin Webb Visit # : [id number]  MRN: [id number] Physician: Luke Salaam DOB/Age 06-10-1975 (Age: 31) Gender: M Collected Date: 12/08/2022 Received Date: 12/08/2022  FINAL DIAGNOSIS       1. Colon, polyp(s), x1 cecum, x1 ascending, cold snare :       - TUBULAR ADENOMA (1 FRAGMENT)      - NEGATIVE FOR HIGH-GRADE DYSPLASIA OR MALIGNANCY       2. Sigmoid  Colon Polyp, cold snare :       - TUBULAR ADENOMA (1 FRAGMENT)      - NEGATIVE FOR HIGH-GRADE DYSPLASIA      - HYPERPLASTIC POLYP (1 FRAGMENT)      - NEGATIVE FOR MALIGNANCY       ELECTRONIC SIGNATURE : [place] M.D., Link Rice., Pathologist, Electronic Signature  MICROSCOPIC DESCRIPTION 1. Only one polyp is identified on microscopic examination.Dr.Anna notified via sec ure chat on 12/09/2022.  CASE COMMENTS STAINS USED IN DIAGNOSIS: H&E H&E    CLINICAL HISTORY  SPECIMEN(S) OBTAINED 1. Colon, polyp(s), X1 Cecum, X1  Ascending, Cold Snare 2. Sigmoid  Colon Polyp, Cold Snare  SPECIMEN COMMENTS: SPECIMEN CLINICAL INFORMATION: 1. Screening colonoscopy, colon polyps    Gross Description 1. Received in formalin, labeled "cold snare polyp x1 cecum colon and cold snare polyp x1 ascending colon", is a 0.5 x 0.3 x  0.1 cm aggregate of soft, tan tissue fragments and food material submitted entirely in block 1A. 2. Received in formalin, labeled "cold snare polyp sigmoid colon", is a 1.0 x 0.5 x 0.2 cm aggregate of soft, tan tissue fragments and food material submitted entirely in block 2A.      SMB      12/08/2022        Report signed out from the following location(s) [place] [address] [place], [place] [address] CLIA #: [id number]  [facility] [address] [place], [place]  [address] CLIA #: [id number]     Assessment and Plan  There are no diagnoses linked to this encounter.  No follow-ups on file.   Herby Lolling, MD

## 2023-07-28 NOTE — Assessment & Plan Note (Signed)
 Acute, most likely musculoskeletal strain.  Given patient history of kidney stones we checked a urinalysis but it was negative for blood or infection. Given this history and some abdominal pain he will return for KUB x-ray to determine if there are any stones.  In the meantime he will push fluids.  He will also start gentle stretching low back exercises, heat and diclofenac 75 mg twice daily as needed for pain and inflammation.  Return and ER precautions provided.

## 2023-07-29 ENCOUNTER — Telehealth: Payer: Self-pay | Admitting: Family Medicine

## 2023-07-29 MED ORDER — DICLOFENAC SODIUM 75 MG PO TBEC: 75.0000 mg | DELAYED_RELEASE_TABLET | Freq: Two times a day (BID) | ORAL | 0 refills | Status: AC

## 2023-07-29 NOTE — Telephone Encounter (Signed)
 Spoke with Dr. Cherlyn Cornet - okay to send in Diclofenac 75mg  BID prn #30 with 0 refills. Rx has been sent in. Pt is aware.

## 2023-07-29 NOTE — Telephone Encounter (Signed)
 Copied from CRM [id number]. Topic: Clinical - Medication Question >> Jul 29, 2023 11:09 AM Dewanda Foots wrote: Reason for CRM: Pt states that the PCP prescribed diclofenac  75 mg for him and was supposed to [id number] [place] - [address] [place] [address] Phone: [phone] Fax: [phone] Hours: Not open 24 hours  But the medication was not called in and I see no notes for refill on his med list. Can we please confirm this is called in for him?   Please call patient back at [phone] when this is done.

## 2023-08-02 ENCOUNTER — Ambulatory Visit (INDEPENDENT_AMBULATORY_CARE_PROVIDER_SITE_OTHER)
Admission: RE | Admit: 2023-08-02 | Discharge: 2023-08-02 | Disposition: A | Discharge status: Home / Self Care | Source: Ambulatory Visit | Attending: Family Medicine | Admitting: Family Medicine

## 2023-08-02 ENCOUNTER — Ambulatory Visit: Payer: Self-pay | Admitting: Family Medicine

## 2023-08-02 DIAGNOSIS — R109 Unspecified abdominal pain: Secondary | ICD-10-CM | POA: Diagnosis not present

## 2023-08-11 ENCOUNTER — Other Ambulatory Visit: Payer: Self-pay | Admitting: Family Medicine

## 2023-08-11 DIAGNOSIS — F411 Generalized anxiety disorder: Secondary | ICD-10-CM

## 2023-08-12 ENCOUNTER — Other Ambulatory Visit: Payer: Self-pay | Admitting: Family Medicine

## 2023-08-12 DIAGNOSIS — F411 Generalized anxiety disorder: Secondary | ICD-10-CM

## 2023-08-12 NOTE — Telephone Encounter (Signed)
 Duplicate request (see 08/12/23 refill note).

## 2023-08-12 NOTE — Telephone Encounter (Unsigned)
 Copied from CRM [id number]. Topic: Clinical - Medication Refill >> Aug 12, 2023  2:20 PM [place] wrote: Medication: LORazepam  (ATIVAN ) 0.5 MG tablet  Has the patient contacted their pharmacy? Yes (Agent: If no, request that the patient contact the pharmacy for the refill. If patient does not wish to contact the pharmacy document the reason why and proceed with request.) (Agent: If yes, when and what did the pharmacy advise?)  This is the patient's preferred pharmacy:  [phone] [place] - [address] [place] [address] Phone: [phone] Fax: [phone]  Is this the correct pharmacy for this prescription? Yes If no, delete pharmacy and type the correct one.   Has the prescription been filled recently? Yes  Is the patient out of the medication? Yes  Has the patient been seen for an appointment in the last year OR does the patient have an upcoming appointment? Yes  Can we respond through MyChart? Yes  Agent: Please be advised that Rx refills may take up to 3 business days. We ask that you follow-up with your pharmacy.

## 2023-08-12 NOTE — Telephone Encounter (Signed)
 Name of Medication:  Lorazepam  Name of Pharmacy:  [facility] Last Fill or Written Date and Quantity:  11/25, #30 Last Office Visit and Type:  01/11/23, acute conjunctivitis  Next Office Visit and Type:  none Last Controlled Substance Agreement Date:  none Last UDS:  none

## 2023-08-12 NOTE — Telephone Encounter (Signed)
 ERx

## 2023-08-16 MED ORDER — LORAZEPAM 0.5 MG PO TABS
0.5000 mg | ORAL_TABLET | Freq: Two times a day (BID) | ORAL | 0 refills | Status: DC | PRN
Start: 2023-08-16 — End: 2023-10-11

## 2023-08-16 NOTE — Telephone Encounter (Signed)
 ERx

## 2023-10-10 ENCOUNTER — Other Ambulatory Visit: Payer: Self-pay | Admitting: Family Medicine

## 2023-10-10 DIAGNOSIS — F411 Generalized anxiety disorder: Secondary | ICD-10-CM

## 2023-10-10 NOTE — Telephone Encounter (Signed)
 Name of Medication:  Lorazepam  Name of Pharmacy:  [facility] Last Fill or Written Date and Quantity:  08/16/2023, #30 Last Office Visit and Type:  01/11/23, acute conjunctivitis  Next Office Visit and Type:  none Last Controlled Substance Agreement Date:  none Last UDS:  none

## 2023-10-11 NOTE — Telephone Encounter (Signed)
 ERx

## 2023-12-12 ENCOUNTER — Other Ambulatory Visit: Payer: Self-pay | Admitting: Family Medicine

## 2023-12-12 DIAGNOSIS — F411 Generalized anxiety disorder: Secondary | ICD-10-CM

## 2023-12-12 NOTE — Telephone Encounter (Signed)
 ERx

## 2024-02-13 ENCOUNTER — Other Ambulatory Visit: Payer: Self-pay | Admitting: Family Medicine

## 2024-02-13 DIAGNOSIS — F411 Generalized anxiety disorder: Secondary | ICD-10-CM

## 2024-02-14 NOTE — Telephone Encounter (Signed)
 Requesting: Ativan  0.5 mg  Contract: No UDS:  Last Visit: 12/12/2023 Next Visit: Visit date not found Last Refill: 12/12/23  Please Advise

## 2024-02-15 NOTE — Telephone Encounter (Signed)
 ERx
